# Patient Record
Sex: Female | Born: 1985 | Race: White | Hispanic: No | Marital: Married | Smoking: Never smoker
Health system: Southern US, Community
[De-identification: ages and names within clinical notes are randomized; demographics above are authoritative.]

## PROBLEM LIST (undated history)

## (undated) DIAGNOSIS — O139 Gestational [pregnancy-induced] hypertension without significant proteinuria, unspecified trimester: Secondary | ICD-10-CM

## (undated) DIAGNOSIS — Z8619 Personal history of other infectious and parasitic diseases: Secondary | ICD-10-CM

## (undated) DIAGNOSIS — L700 Acne vulgaris: Secondary | ICD-10-CM

## (undated) HISTORY — DX: Gestational (pregnancy-induced) hypertension without significant proteinuria, unspecified trimester: O13.9

## (undated) HISTORY — PX: NO PAST SURGERIES: SHX2092

## (undated) HISTORY — DX: Personal history of other infectious and parasitic diseases: Z86.19

---
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
## 2007-02-06 ENCOUNTER — Other Ambulatory Visit: Admission: RE | Admit: 2007-02-06 | Discharge: 2007-02-06 | Payer: Self-pay | Admitting: Obstetrics and Gynecology

## 2007-08-28 ENCOUNTER — Inpatient Hospital Stay (HOSPITAL_COMMUNITY): Admission: AD | Admit: 2007-08-28 | Discharge: 2007-08-31 | Payer: Self-pay | Admitting: Obstetrics and Gynecology

## 2010-05-29 NOTE — L&D Delivery Note (Signed)
Delivery Note At 1:12 AM a viable female was delivered via Vaginal, Spontaneous Delivery (Presentation: Right Occiput Posterior).  APGAR: 8, 9; weight 8 lb 13.1 oz (4000 g).   Placenta status: Intact, Spontaneous.  Cord: 3 vessels with the following complications: None.  Cord pH: NA  Anesthesia: Epidural  Episiotomy: None Lacerations: 2nd degree;Perineal Suture Repair: 3.0 vicryl Est. Blood Loss (mL): 250 ml  Mom to postpartum.  Baby to nursery-stable.  Jessica Pugh J. 05/13/2011, 1:37 AM

## 2010-09-12 LAB — ANTIBODY SCREEN: Antibody Screen: NEGATIVE

## 2010-09-12 LAB — ABO/RH: RH Type: NEGATIVE

## 2010-09-12 LAB — HIV ANTIBODY (ROUTINE TESTING W REFLEX): HIV: NONREACTIVE

## 2010-09-12 LAB — RUBELLA ANTIBODY, IGM: Rubella: IMMUNE

## 2010-10-11 NOTE — H&P (Signed)
NAME:  Jessica Pugh, Jessica Pugh                 ACCOUNT NO.:  [id number]   MEDICAL RECORD NO.:  [id number]          PATIENT TYPE:  INP   LOCATION:  9167                          FACILITY:  [facility]   PHYSICIAN:  Charles A. Delcambre, MDDATE OF BIRTH:  Jan 08, 1986   DATE OF ADMISSION:  08/28/2007  DATE OF DISCHARGE:                              HISTORY & PHYSICAL    A 25 year old gravida 1, para 0-0-0-0, [facility] September 14, 2007, at 39 weeks  and 6 days, presented to the office today for routine visit and was  noted to have blood pressure of 156/98, history of a headache last night  relieved with Tylenol.  Rechecked blood pressure was 150/96, trace  proteinuria, 8 pounds of weight gain over the last week, and see  physical exam below otherwise.  She is directly admitted now to labor  and delivery for induction secondary to gestational hypertension.   OBSTETRICAL LABS:  Antibody screen negative, VDRL nonreactive, rubella  immune, hepatitis B surface antigen negative, HIV negative, declined  first trimester screen and quad screen, 1-hour Glucola 95, antibody  screen negative at 28 weeks, hemoglobin 11.4 at 28 weeks, RPR at 28  weeks nonreactive, HIV at 36 weeks negative, gonorrhea and Chlamydia at  36 weeks both negative, group B strep is negative at 36 weeks.   PAST MEDICAL HISTORY:  None.   SURGICAL HISTORY:  None.   MEDICATIONS:  Allegra p.r.n. for allergies.   ALLERGIES:  No known drug allergies.   SOCIAL HISTORY:  Married, monogamous with her husband.   FAMILY HISTORY:  No major illnesses contributing.  Mother and multiple  others have had preeclampsia is only significance.   REVIEW OF SYSTEMS:  Headache last night resolved with Tylenol, no  scotomata or right upper quadrant pain.   Blood pressures as noted 156/98, repeat 150/96.  PIH LABS:  Negative and are faxed to labor and delivery.  HEENT:  Grossly normal limits.  LUNGS:  Clear bilaterally.  HEART:  Regular rate and rhythm with 2/6  systolic ejection murmur left  sternal border.  ABDOMEN:  Gravida, fundal height 40 cm, fetal heart rate 155.  Ultrasound estimated fetal weight 3174 g on August 21, 2007, 40th-50th  percentile, AFI 23.93rd percentile on August 21, 2007.  PELVIC EXAM:  Did not tolerate much of the pelvic exam.  One finger was  tolerated, but I could not reach the cervix.  She had a tight hymenal  ring and this did not tolerate a 2-digit exam.  Deep tendon reflexes are 3+ on the left, 2+ on the right, very brisk  edema is present up to knee and tight bilaterally.  Clonus 2 beats on the right, 3 beats on the left.   ASSESSMENT:  1. Gestational hypertension 39 weeks 6 days.  2. Unfavorable cervix with limitation of exam as noted above.   PLAN:  1. Direct admission, will use magnesium prophylaxis if blood pressures      remain elevated.  2. Cytotec 25 mcg q.4 h. Once she is 3 cm dilated, we will initiate      low-dose Pitocin and  attempt to AROM as indicated.  PIH labs faxed      to labor and delivery and I note that they are creatinine 0.7, uric      acid 5.8, AST 25, ALT 16, hemoglobin 10.9, hematocrit 32.3,      platelets 173.  Allow to eat lunch if not in labor, same for dinner      if not in labor, continue Cytotec until dilated to 3 cm.  If unable      to place Cytotec, initiate low dose Pitocin.  She gives informed      consent and will follow up as directed.  She did receive RhoGAM at      28 weeks.      Charles A. Sydnee Cabal, MD  Electronically Signed     CAD/MEDQ  D:  08/28/2007  T:  08/29/2007  Job:  [id number]

## 2010-10-19 ENCOUNTER — Other Ambulatory Visit (HOSPITAL_COMMUNITY)
Admission: RE | Admit: 2010-10-19 | Discharge: 2010-10-19 | Disposition: A | Discharge status: Home / Self Care | Payer: 59 | Source: Ambulatory Visit | Attending: Obstetrics and Gynecology | Admitting: Obstetrics and Gynecology

## 2010-10-19 DIAGNOSIS — Z113 Encounter for screening for infections with a predominantly sexual mode of transmission: Secondary | ICD-10-CM | POA: Insufficient documentation

## 2010-10-19 DIAGNOSIS — Z01419 Encounter for gynecological examination (general) (routine) without abnormal findings: Secondary | ICD-10-CM | POA: Insufficient documentation

## 2011-02-21 LAB — COMPREHENSIVE METABOLIC PANEL
ALT: 13
AST: 41 — ABNORMAL HIGH
Albumin: 1.6 — ABNORMAL LOW
Albumin: 2.2 — ABNORMAL LOW
Alkaline Phosphatase: 189 — ABNORMAL HIGH
BUN: 6
BUN: 6
CO2: 26
Calcium: 6.6 — ABNORMAL LOW
Calcium: 7.5 — ABNORMAL LOW
Chloride: 102
Creatinine, Ser: 0.87
Creatinine, Ser: 0.95
GFR calc Af Amer: >60
GFR calc non Af Amer: >60
Glucose, Bld: 88
Potassium: 4.2
Total Bilirubin: 0.5
Total Bilirubin: 0.9
Total Protein: 5.7 — ABNORMAL LOW

## 2011-02-21 LAB — CBC
HCT: 22.2 — ABNORMAL LOW
HCT: 24.6 — ABNORMAL LOW
HCT: 29.8 — ABNORMAL LOW
HCT: 34.2 — ABNORMAL LOW
Hemoglobin: 10.5 — ABNORMAL LOW
Hemoglobin: 11.5 — ABNORMAL LOW
Hemoglobin: 7.6 — CL
MCHC: 33.7
MCHC: 34.2
MCHC: 35.2
MCV: 90.6
MCV: 91.4
MCV: 91.9
MCV: 92.2
Platelets: 172
Platelets: 183
Platelets: 189
Platelets: 191
RBC: 2.42 — ABNORMAL LOW
RBC: 3.29 — ABNORMAL LOW
RBC: 3.71 — ABNORMAL LOW
RDW: 13.1
RDW: 13.2
WBC: 11.2 — ABNORMAL HIGH
WBC: 18.8 — ABNORMAL HIGH
WBC: 23.4 — ABNORMAL HIGH
WBC: 26.3 — ABNORMAL HIGH

## 2011-02-21 LAB — RH IMMUNE GLOB WKUP(>/=20WKS)(NOT WOMEN'S HOSP)

## 2011-02-21 LAB — LACTATE DEHYDROGENASE: LDH: 438 — ABNORMAL HIGH

## 2011-02-21 LAB — URIC ACID
Uric Acid, Serum: 5.7
Uric Acid, Serum: 6.7

## 2011-02-21 LAB — DIFFERENTIAL
Eosinophils Absolute: 0
Lymphs Abs: 1.3
Neutro Abs: 21.1 — ABNORMAL HIGH
Neutrophils Relative %: 90 — ABNORMAL HIGH

## 2011-02-21 LAB — RPR: RPR Ser Ql: NONREACTIVE

## 2011-02-21 LAB — MAGNESIUM
Magnesium: 5.5 — ABNORMAL HIGH
Magnesium: 6 — ABNORMAL HIGH
Magnesium: 6.2
Magnesium: 6.9
Magnesium: 8

## 2011-05-08 ENCOUNTER — Encounter (HOSPITAL_COMMUNITY): Payer: Self-pay | Admitting: *Deleted

## 2011-05-08 ENCOUNTER — Telehealth (HOSPITAL_COMMUNITY): Payer: Self-pay | Admitting: *Deleted

## 2011-05-08 NOTE — Telephone Encounter (Signed)
Preadmission screen  

## 2011-05-11 ENCOUNTER — Other Ambulatory Visit: Payer: Self-pay | Admitting: Obstetrics and Gynecology

## 2011-05-12 ENCOUNTER — Inpatient Hospital Stay (HOSPITAL_COMMUNITY): Admission: RE | Admit: 2011-05-12 | Payer: 59 | Source: Ambulatory Visit

## 2011-05-12 ENCOUNTER — Encounter (HOSPITAL_COMMUNITY): Payer: Self-pay

## 2011-05-12 ENCOUNTER — Inpatient Hospital Stay (HOSPITAL_COMMUNITY): Payer: 59 | Admitting: Anesthesiology

## 2011-05-12 ENCOUNTER — Encounter (HOSPITAL_COMMUNITY): Payer: Self-pay | Admitting: Anesthesiology

## 2011-05-12 ENCOUNTER — Inpatient Hospital Stay (HOSPITAL_COMMUNITY)
Admission: RE | Admit: 2011-05-12 | Discharge: 2011-05-14 | DRG: 775 | Disposition: A | Discharge status: Home / Self Care | Payer: 59 | Source: Ambulatory Visit | Attending: Obstetrics and Gynecology | Admitting: Obstetrics and Gynecology

## 2011-05-12 LAB — CBC
MCH: 31.6 pg (ref 26.0–34.0)
Platelets: 194 10*3/uL (ref 150–400)
RBC: 3.7 MIL/uL — ABNORMAL LOW (ref 3.87–5.11)
RDW: 12.5 % (ref 11.5–15.5)
WBC: 11.6 10*3/uL — ABNORMAL HIGH (ref 4.0–10.5)

## 2011-05-12 LAB — RPR: RPR Ser Ql: NONREACTIVE

## 2011-05-12 MED ORDER — LIDOCAINE HCL 1.5 % IJ SOLN
INTRAMUSCULAR | Status: DC | PRN
  Administered 2011-05-12: 4 mL via INTRADERMAL
  Administered 2011-05-12: 3 mL via EPIDURAL

## 2011-05-12 MED ORDER — OXYTOCIN 20 UNITS IN LACTATED RINGERS INFUSION - SIMPLE
INTRAVENOUS | Status: AC
  Administered 2011-05-12: 2 m[IU]/min via INTRAVENOUS
  Filled 2011-05-12: qty 1000

## 2011-05-12 MED ORDER — PHENYLEPHRINE 40 MCG/ML (10ML) SYRINGE FOR IV PUSH (FOR BLOOD PRESSURE SUPPORT)
80.0000 ug | PREFILLED_SYRINGE | INTRAVENOUS | Status: DC | PRN
  Filled 2011-05-12: qty 5

## 2011-05-12 MED ORDER — ONDANSETRON HCL 4 MG/2ML IJ SOLN: 4.0000 mg | Freq: Four times a day (QID) | INTRAMUSCULAR | Status: DC | PRN

## 2011-05-12 MED ORDER — FENTANYL 2.5 MCG/ML BUPIVACAINE 1/10 % EPIDURAL INFUSION (WH - ANES)
INTRAMUSCULAR | Status: DC | PRN
  Administered 2011-05-12: 13 mL/h via EPIDURAL

## 2011-05-12 MED ORDER — LACTATED RINGERS IV SOLN
INTRAVENOUS | Status: DC
  Administered 2011-05-12 (×4): via INTRAVENOUS

## 2011-05-12 MED ORDER — LIDOCAINE HCL (PF) 1 % IJ SOLN
30.0000 mL | INTRAMUSCULAR | Status: DC | PRN
  Filled 2011-05-12: qty 30

## 2011-05-12 MED ORDER — FENTANYL 2.5 MCG/ML BUPIVACAINE 1/10 % EPIDURAL INFUSION (WH - ANES)
14.0000 mL/h | INTRAMUSCULAR | Status: DC
  Administered 2011-05-12: 14 mL/h via EPIDURAL
  Filled 2011-05-12 (×2): qty 60

## 2011-05-12 MED ORDER — FLEET ENEMA 7-19 GM/118ML RE ENEM: 1.0000 | ENEMA | RECTAL | Status: DC | PRN

## 2011-05-12 MED ORDER — TERBUTALINE SULFATE 1 MG/ML IJ SOLN: 0.2500 mg | Freq: Once | INTRAMUSCULAR | Status: AC | PRN

## 2011-05-12 MED ORDER — OXYTOCIN 20 UNITS IN LACTATED RINGERS INFUSION - SIMPLE
125.0000 mL/h | Freq: Once | INTRAVENOUS | Status: AC
  Administered 2011-05-13: 999 mL/h via INTRAVENOUS

## 2011-05-12 MED ORDER — LACTATED RINGERS IV SOLN
500.0000 mL | INTRAVENOUS | Status: DC | PRN
  Administered 2011-05-12: 500 mL via INTRAVENOUS

## 2011-05-12 MED ORDER — OXYTOCIN BOLUS FROM INFUSION
500.0000 mL | Freq: Once | INTRAVENOUS | Status: DC
  Filled 2011-05-12: qty 500

## 2011-05-12 MED ORDER — ACETAMINOPHEN 325 MG PO TABS: 650.0000 mg | ORAL_TABLET | ORAL | Status: DC | PRN

## 2011-05-12 MED ORDER — LACTATED RINGERS IV SOLN
500.0000 mL | Freq: Once | INTRAVENOUS | Status: AC
  Administered 2011-05-12: 500 mL via INTRAVENOUS

## 2011-05-12 MED ORDER — PHENYLEPHRINE 40 MCG/ML (10ML) SYRINGE FOR IV PUSH (FOR BLOOD PRESSURE SUPPORT): 80.0000 ug | PREFILLED_SYRINGE | INTRAVENOUS | Status: DC | PRN

## 2011-05-12 MED ORDER — OXYCODONE-ACETAMINOPHEN 5-325 MG PO TABS: 2.0000 | ORAL_TABLET | ORAL | Status: DC | PRN

## 2011-05-12 MED ORDER — DIPHENHYDRAMINE HCL 50 MG/ML IJ SOLN: 12.5000 mg | INTRAMUSCULAR | Status: DC | PRN

## 2011-05-12 MED ORDER — EPHEDRINE 5 MG/ML INJ
10.0000 mg | INTRAVENOUS | Status: DC | PRN
  Filled 2011-05-12: qty 4

## 2011-05-12 MED ORDER — MISOPROSTOL 25 MCG QUARTER TABLET
25.0000 ug | ORAL_TABLET | ORAL | Status: DC | PRN
  Administered 2011-05-12: 25 ug via VAGINAL
  Filled 2011-05-12: qty 0.25

## 2011-05-12 MED ORDER — IBUPROFEN 600 MG PO TABS: 600.0000 mg | ORAL_TABLET | Freq: Four times a day (QID) | ORAL | Status: DC | PRN

## 2011-05-12 MED ORDER — OXYTOCIN 20 UNITS IN LACTATED RINGERS INFUSION - SIMPLE
1.0000 m[IU]/min | INTRAVENOUS | Status: DC
  Administered 2011-05-12: 2 m[IU]/min via INTRAVENOUS

## 2011-05-12 MED ORDER — CITRIC ACID-SODIUM CITRATE 334-500 MG/5ML PO SOLN: 30.0000 mL | ORAL | Status: DC | PRN

## 2011-05-12 MED ORDER — EPHEDRINE 5 MG/ML INJ: 10.0000 mg | INTRAVENOUS | Status: DC | PRN

## 2011-05-12 NOTE — H&P (Signed)
Jessica Pugh is a 25 y.o. female presenting for elective induction at 40 wks. She has a h/o Preeclampsia with previous pregnancy. She denies ha/ blurred vision or ruq pain.   .  OB History    Grav Para Term Preterm Abortions TAB SAB Ect Mult Living   2 1 1       1      Past Medical History  Diagnosis Date  . Pregnancy induced hypertension     first pregnancy  . History of chicken pox    Past Surgical History  Procedure Date  . No past surgeries    Family History: family history is not on file. Social History:  reports that she has never smoked. She does not have any smokeless tobacco history on file. She reports that she does not drink alcohol or use illicit drugs.  Meds PNV Allergies nkda PGYN hx negative   Dilation: Fingertip Effacement (%): 50 Station: -2 Exam by:: Dr. Richardson Dopp Blood pressure 127/75, pulse 87, temperature 98 F (36.7 C), temperature source Oral, resp. rate 18, height 5\' 1"  (1.549 m), weight 66.225 kg (146 lb), last menstrual period 07/19/2010. CV rrr Lungs clear abd gravid nontender Ext trace edema 1+ reflexes  Prenatal labs: ABO, Rh: O/Negative/-- (04/16 0000) Antibody: Negative (04/16 0000) Rubella:   RPR: NON REACTIVE (12/14 0753)  HBsAg: Negative (04/16 0000)  HIV: Non-reactive (04/16 0000)  GBS: Negative (11/12 0000)   Assessment/Plan: 40 wks for social induction cytotec for cervical ripening.     Jessica Pugh J. 05/12/2011, 1:48 PM

## 2011-05-12 NOTE — Treatment Plan (Signed)
Dr. Richardson Dopp on phone, updated on pt status, notified of UC pattern, FHR reactive, orders received to hold Cytotec until UC less frequent then place Cytotec at that time.  Raliegh Ip RN

## 2011-05-12 NOTE — Progress Notes (Signed)
Dr. Richardson Dopp at bedside, placed IUPC, orders received to recheck pt cervix in 1 hr and notify MD of SVE,  if no cervical change, orders received to begin low dose Pitocin per protocol

## 2011-05-12 NOTE — Anesthesia Procedure Notes (Signed)
Epidural Patient location during procedure: OB Start time: 05/12/2011 6:44 PM  Staffing Anesthesiologist: Arye Weyenberg A. Performed by: anesthesiologist   Preanesthetic Checklist Completed: patient identified, site marked, surgical consent, pre-op evaluation, timeout performed, IV checked, risks and benefits discussed and monitors and equipment checked  Epidural Patient position: sitting Prep: site prepped and draped and DuraPrep Patient monitoring: continuous pulse ox and blood pressure Approach: midline Injection technique: LOR air  Needle:  Needle type: Tuohy  Needle gauge: 17 G Needle length: 9 cm Needle insertion depth: 5 cm cm Catheter type: closed end flexible Catheter size: 19 Gauge Catheter at skin depth: 10 cm Test dose: negative and 1.5% lidocaine  Assessment Events: blood not aspirated, injection not painful, no injection resistance, negative IV test and no paresthesia  Additional Notes Patient is more comfortable after epidural dosed. Please see RN's note for documentation of vital signs and FHR which are stable.

## 2011-05-12 NOTE — Plan of Care (Signed)
Dr. Richardson Dopp called, notified of tachysystolic UC pattern, FHR reactive, orders received to D/C Cytotec order.

## 2011-05-12 NOTE — Progress Notes (Signed)
Jessica Pugh is a 25 y.o. G2P1001 at [redacted]w[redacted]d by LMP admitted for induction of labor due to elective social induciton .  Subjective: pt desires an epidural    Objective: BP 126/67  Pulse 95  Temp(Src) 98.2 F (36.8 C) (Oral)  Resp 20  Ht 5\' 1"  (1.549 m)  Wt 66.225 kg (146 lb)  BMI 27.59 kg/m2  LMP 07/19/2010      FHT:  FHR: 140 bpm, variability: moderate,  accelerations:  Present,  decelerations:  Absent UC:   regular, every 1 minutes SVE:   Dilation: 3.5 Effacement (%): 50 Station: -1 Exam by:: Dr. Richardson Dopp  Labs: Lab Results  Component Value Date   WBC 11.6* 05/12/2011   HGB 11.7* 05/12/2011   HCT 34.3* 05/12/2011   MCV 92.7 05/12/2011   PLT 194 05/12/2011    Assessment / Plan: Induction of labor due to social,  progressing well on pitocin Pt is not on pitocin she had one dosage of cytotect at 830am  Labor: progressing after one dose of cytotec  Preeclampsia:  no signs or symptoms of toxicity Fetal Wellbeing:  Category I Pain Control:  Epidural I/D:  n/a Anticipated MOD:  NSVD  Jessica Pugh J. 05/12/2011, 5:40 PM

## 2011-05-12 NOTE — Progress Notes (Signed)
In to assess patient. She is comfortable with her epidural  AF VSS FHR baseline 140's good btbv + accels no decels  Toco ctx q 1-3 minutes cx 3-4 /50/-1 Arom clear fluid A/p 40 wks for social induction  Anticipate svd If no cervical change in 1 hr add low dose pitocin

## 2011-05-12 NOTE — Anesthesia Preprocedure Evaluation (Signed)
Anesthesia Evaluation  Patient identified by MRN, date of birth, ID band Patient awake    Reviewed: Allergy & Precautions, H&P , Patient's Chart, lab work & pertinent test results  Airway Mallampati: III TM Distance: >3 FB Neck ROM: full    Dental No notable dental hx. (+) Teeth Intact   Pulmonary neg pulmonary ROS,  clear to auscultation  Pulmonary exam normal       Cardiovascular neg cardio ROS regular Normal    Neuro/Psych Negative Neurological ROS  Negative Psych ROS   GI/Hepatic negative GI ROS, Neg liver ROS,   Endo/Other  Negative Endocrine ROS  Renal/GU negative Renal ROS  Genitourinary negative   Musculoskeletal   Abdominal Normal abdominal exam  (+)   Peds  Hematology negative hematology ROS (+)   Anesthesia Other Findings   Reproductive/Obstetrics (+) Pregnancy                           Anesthesia Physical Anesthesia Plan  ASA: II  Anesthesia Plan: Epidural   Post-op Pain Management:    Induction:   Airway Management Planned:   Additional Equipment:   Intra-op Plan:   Post-operative Plan:   Informed Consent: I have reviewed the patients History and Physical, chart, labs and discussed the procedure including the risks, benefits and alternatives for the proposed anesthesia with the patient or authorized representative who has indicated his/her understanding and acceptance.     Plan Discussed with: Anesthesiologist  Anesthesia Plan Comments:         Anesthesia Quick Evaluation  

## 2011-05-13 ENCOUNTER — Encounter (HOSPITAL_COMMUNITY): Payer: Self-pay

## 2011-05-13 LAB — ABO/RH: ABO/RH(D): O NEG

## 2011-05-13 LAB — CBC
HCT: 27.7 % — ABNORMAL LOW (ref 36.0–46.0)
Hemoglobin: 9.4 g/dL — ABNORMAL LOW (ref 12.0–15.0)
MCHC: 34.3 g/dL (ref 30.0–36.0)
MCV: 92.6 fL (ref 78.0–100.0)

## 2011-05-13 MED ORDER — IBUPROFEN 600 MG PO TABS
600.0000 mg | ORAL_TABLET | Freq: Four times a day (QID) | ORAL | Status: DC
  Administered 2011-05-13 – 2011-05-14 (×5): 600 mg via ORAL
  Filled 2011-05-13 (×5): qty 1

## 2011-05-13 MED ORDER — DIBUCAINE 1 % RE OINT: 1.0000 "application " | TOPICAL_OINTMENT | RECTAL | Status: DC | PRN

## 2011-05-13 MED ORDER — ONDANSETRON HCL 4 MG PO TABS: 4.0000 mg | ORAL_TABLET | ORAL | Status: DC | PRN

## 2011-05-13 MED ORDER — SIMETHICONE 80 MG PO CHEW: 80.0000 mg | CHEWABLE_TABLET | ORAL | Status: DC | PRN

## 2011-05-13 MED ORDER — WITCH HAZEL-GLYCERIN EX PADS: 1.0000 "application " | MEDICATED_PAD | CUTANEOUS | Status: DC | PRN

## 2011-05-13 MED ORDER — ZOLPIDEM TARTRATE 5 MG PO TABS: 5.0000 mg | ORAL_TABLET | Freq: Every evening | ORAL | Status: DC | PRN

## 2011-05-13 MED ORDER — BENZOCAINE-MENTHOL 20-0.5 % EX AERO
1.0000 "application " | INHALATION_SPRAY | CUTANEOUS | Status: DC | PRN
  Administered 2011-05-13: 1 via TOPICAL

## 2011-05-13 MED ORDER — METHYLERGONOVINE MALEATE 0.2 MG/ML IJ SOLN: 0.2000 mg | INTRAMUSCULAR | Status: DC | PRN

## 2011-05-13 MED ORDER — METHYLERGONOVINE MALEATE 0.2 MG PO TABS: 0.2000 mg | ORAL_TABLET | ORAL | Status: DC | PRN

## 2011-05-13 MED ORDER — OXYCODONE-ACETAMINOPHEN 5-325 MG PO TABS
1.0000 | ORAL_TABLET | ORAL | Status: DC | PRN
  Administered 2011-05-13 (×3): 1 via ORAL
  Filled 2011-05-13 (×3): qty 1

## 2011-05-13 MED ORDER — LACTATED RINGERS IV SOLN
INTRAVENOUS | Status: DC
  Administered 2011-05-12: 23:00:00 via INTRAUTERINE

## 2011-05-13 MED ORDER — PRENATAL PLUS 27-1 MG PO TABS
1.0000 | ORAL_TABLET | Freq: Every day | ORAL | Status: DC
  Administered 2011-05-13 – 2011-05-14 (×2): 1 via ORAL
  Filled 2011-05-13 (×2): qty 1

## 2011-05-13 MED ORDER — DIPHENHYDRAMINE HCL 25 MG PO CAPS: 25.0000 mg | ORAL_CAPSULE | Freq: Four times a day (QID) | ORAL | Status: DC | PRN

## 2011-05-13 MED ORDER — ONDANSETRON HCL 4 MG/2ML IJ SOLN: 4.0000 mg | INTRAMUSCULAR | Status: DC | PRN

## 2011-05-13 MED ORDER — LANOLIN HYDROUS EX OINT: TOPICAL_OINTMENT | CUTANEOUS | Status: DC | PRN

## 2011-05-13 MED ORDER — SENNOSIDES-DOCUSATE SODIUM 8.6-50 MG PO TABS
2.0000 | ORAL_TABLET | Freq: Every day | ORAL | Status: DC
  Administered 2011-05-13: 2 via ORAL

## 2011-05-13 MED ORDER — BENZOCAINE-MENTHOL 20-0.5 % EX AERO
INHALATION_SPRAY | CUTANEOUS | Status: AC
  Administered 2011-05-13: 1 via TOPICAL
  Filled 2011-05-13: qty 56

## 2011-05-13 NOTE — Anesthesia Postprocedure Evaluation (Signed)
Anesthesia Post Note  Patient: Jessica Pugh  Procedure(s) Performed: * No procedures listed *  Anesthesia type: Epidural  Patient location: Mother/Baby  Post pain: Pain level controlled  Post assessment: Post-op Vital signs reviewed  Last Vitals:  Filed Vitals:   05/13/11 0900  BP: 110/68  Pulse: 76  Temp: 36.7 C  Resp: 18    Post vital signs: Reviewed  Level of consciousness: awake  Complications: No apparent anesthesia complications

## 2011-05-14 MED ORDER — IBUPROFEN 600 MG PO TABS: 600.0000 mg | ORAL_TABLET | Freq: Four times a day (QID) | ORAL | Status: AC | PRN

## 2011-05-14 MED ORDER — RHO D IMMUNE GLOBULIN 1500 UNIT/2ML IJ SOLN
300.0000 ug | Freq: Once | INTRAMUSCULAR | Status: AC
  Administered 2011-05-14: 300 ug via INTRAMUSCULAR
  Filled 2011-05-14: qty 2

## 2011-05-14 NOTE — Discharge Summary (Signed)
Obstetric Discharge Summary Reason for Admission: induction of labor Prenatal Procedures: none Intrapartum Procedures: spontaneous vaginal delivery Postpartum Procedures: none Complications-Operative and Postpartum: 2nd degree perineal laceration Hemoglobin  Date Value Range Status  05/13/2011 9.4* 12.0-15.0 (g/dL) Final     REPEATED TO VERIFY     DELTA CHECK NOTED     HCT  Date Value Range Status  05/13/2011 27.7* 36.0-46.0 (%) Final    Discharge Diagnoses: Term Pregnancy-delivered  Discharge Information: Date: 05/14/2011 Activity: pelvic rest Diet: routine Medications: PNV and Ibuprofen Condition: stable Instructions: refer to practice specific booklet Discharge to: home Follow-up Information    Follow up with [name] Call in 1 week. (Nurse visit for bp check )    Contact information:   [address] [place] [address] [phone]          Newborn Data: Live born female  Birth Weight: 8 lb 13.1 oz (4000 g) APGAR: 8, 9  Home with mother.  Jessica Pugh J. 05/14/2011, 9:52 AM

## 2011-05-14 NOTE — Progress Notes (Signed)
Post Partum Day 1 Subjective: no complaints, up ad lib, voiding and tolerating PO  Objective: Blood pressure 99/60, pulse 79, temperature 98 F (36.7 C), temperature source Oral, resp. rate 18, height 5\' 1"  (1.549 m), weight 66.225 kg (146 lb), last menstrual period 07/19/2010, SpO2 98.00%, unknown if currently breastfeeding.  Physical Exam:  General: alert and cooperative Lochia: appropriate Uterine Fundus: firm Incision: NA DVT Evaluation: No evidence of DVT seen on physical exam.   Basename 05/13/11 0547 05/12/11 0753  HGB 9.4* 11.7*  HCT 27.7* 34.3*    Assessment/Plan: Discharge home and Contraception will discuss at postpartum vist.   LOS: 2 days   Rickardo Brinegar J. 05/14/2011, 9:49 AM

## 2011-05-15 LAB — RH IG WORKUP (INCLUDES ABO/RH)
ABO/RH(D): O NEG
Unit division: 0

## 2014-03-30 ENCOUNTER — Encounter (HOSPITAL_COMMUNITY): Payer: Self-pay

## 2018-02-04 ENCOUNTER — Other Ambulatory Visit: Payer: Self-pay

## 2018-02-04 ENCOUNTER — Encounter (HOSPITAL_BASED_OUTPATIENT_CLINIC_OR_DEPARTMENT_OTHER): Payer: Self-pay

## 2018-02-04 DIAGNOSIS — I351 Nonrheumatic aortic (valve) insufficiency: Secondary | ICD-10-CM | POA: Diagnosis present

## 2018-02-04 DIAGNOSIS — L02213 Cutaneous abscess of chest wall: Secondary | ICD-10-CM | POA: Diagnosis present

## 2018-02-04 DIAGNOSIS — E041 Nontoxic single thyroid nodule: Secondary | ICD-10-CM | POA: Diagnosis not present

## 2018-02-04 DIAGNOSIS — Z23 Encounter for immunization: Secondary | ICD-10-CM

## 2018-02-04 DIAGNOSIS — J189 Pneumonia, unspecified organism: Secondary | ICD-10-CM | POA: Diagnosis not present

## 2018-02-04 DIAGNOSIS — D649 Anemia, unspecified: Secondary | ICD-10-CM | POA: Diagnosis present

## 2018-02-04 DIAGNOSIS — R7881 Bacteremia: Secondary | ICD-10-CM | POA: Diagnosis present

## 2018-02-04 DIAGNOSIS — Z8042 Family history of malignant neoplasm of prostate: Secondary | ICD-10-CM

## 2018-02-04 DIAGNOSIS — I2690 Septic pulmonary embolism without acute cor pulmonale: Secondary | ICD-10-CM | POA: Diagnosis present

## 2018-02-04 DIAGNOSIS — R091 Pleurisy: Secondary | ICD-10-CM | POA: Diagnosis present

## 2018-02-04 DIAGNOSIS — Z841 Family history of disorders of kidney and ureter: Secondary | ICD-10-CM

## 2018-02-04 DIAGNOSIS — F419 Anxiety disorder, unspecified: Secondary | ICD-10-CM | POA: Diagnosis present

## 2018-02-04 DIAGNOSIS — L700 Acne vulgaris: Secondary | ICD-10-CM | POA: Diagnosis present

## 2018-02-04 DIAGNOSIS — B9561 Methicillin susceptible Staphylococcus aureus infection as the cause of diseases classified elsewhere: Secondary | ICD-10-CM | POA: Diagnosis present

## 2018-02-04 LAB — URINALYSIS, MICROSCOPIC (REFLEX)

## 2018-02-04 LAB — URINALYSIS, ROUTINE W REFLEX MICROSCOPIC
Bilirubin Urine: NEGATIVE
GLUCOSE, UA: NEGATIVE mg/dL
Ketones, ur: NEGATIVE mg/dL
LEUKOCYTES UA: NEGATIVE
NITRITE: NEGATIVE
PROTEIN: NEGATIVE mg/dL
Specific Gravity, Urine: 1.02 (ref 1.005–1.030)
pH: 5.5 (ref 5.0–8.0)

## 2018-02-04 LAB — PREGNANCY, URINE: Preg Test, Ur: NEGATIVE

## 2018-02-04 NOTE — ED Triage Notes (Signed)
Pt c/o mid to upper back pain for the last few days that is worse with movement, also has what sounds like an abscess to chest, pt tried tylenol yesterday

## 2018-02-05 ENCOUNTER — Emergency Department (HOSPITAL_BASED_OUTPATIENT_CLINIC_OR_DEPARTMENT_OTHER): Payer: 59

## 2018-02-05 ENCOUNTER — Observation Stay (HOSPITAL_BASED_OUTPATIENT_CLINIC_OR_DEPARTMENT_OTHER): Payer: 59

## 2018-02-05 ENCOUNTER — Inpatient Hospital Stay (HOSPITAL_BASED_OUTPATIENT_CLINIC_OR_DEPARTMENT_OTHER)
Admission: EM | Admit: 2018-02-05 | Discharge: 2018-02-08 | DRG: 193 | Disposition: A | Discharge status: Home / Self Care | Payer: 59 | Attending: Internal Medicine | Admitting: Internal Medicine

## 2018-02-05 ENCOUNTER — Encounter (HOSPITAL_COMMUNITY): Payer: Self-pay | Admitting: Internal Medicine

## 2018-02-05 DIAGNOSIS — I76 Septic arterial embolism: Secondary | ICD-10-CM | POA: Insufficient documentation

## 2018-02-05 DIAGNOSIS — F419 Anxiety disorder, unspecified: Secondary | ICD-10-CM | POA: Diagnosis present

## 2018-02-05 DIAGNOSIS — L02213 Cutaneous abscess of chest wall: Secondary | ICD-10-CM | POA: Diagnosis not present

## 2018-02-05 DIAGNOSIS — Z8042 Family history of malignant neoplasm of prostate: Secondary | ICD-10-CM | POA: Diagnosis not present

## 2018-02-05 DIAGNOSIS — B9561 Methicillin susceptible Staphylococcus aureus infection as the cause of diseases classified elsewhere: Secondary | ICD-10-CM | POA: Diagnosis present

## 2018-02-05 DIAGNOSIS — L700 Acne vulgaris: Secondary | ICD-10-CM

## 2018-02-05 DIAGNOSIS — R7881 Bacteremia: Secondary | ICD-10-CM

## 2018-02-05 DIAGNOSIS — D72829 Elevated white blood cell count, unspecified: Secondary | ICD-10-CM

## 2018-02-05 DIAGNOSIS — Z23 Encounter for immunization: Secondary | ICD-10-CM | POA: Diagnosis not present

## 2018-02-05 DIAGNOSIS — J189 Pneumonia, unspecified organism: Secondary | ICD-10-CM | POA: Diagnosis not present

## 2018-02-05 DIAGNOSIS — D649 Anemia, unspecified: Secondary | ICD-10-CM | POA: Diagnosis present

## 2018-02-05 DIAGNOSIS — L0291 Cutaneous abscess, unspecified: Secondary | ICD-10-CM | POA: Diagnosis not present

## 2018-02-05 DIAGNOSIS — I351 Nonrheumatic aortic (valve) insufficiency: Secondary | ICD-10-CM | POA: Diagnosis present

## 2018-02-05 DIAGNOSIS — E041 Nontoxic single thyroid nodule: Secondary | ICD-10-CM | POA: Diagnosis present

## 2018-02-05 DIAGNOSIS — I2690 Septic pulmonary embolism without acute cor pulmonale: Secondary | ICD-10-CM | POA: Diagnosis not present

## 2018-02-05 DIAGNOSIS — Z841 Family history of disorders of kidney and ureter: Secondary | ICD-10-CM | POA: Diagnosis not present

## 2018-02-05 DIAGNOSIS — R091 Pleurisy: Secondary | ICD-10-CM | POA: Diagnosis not present

## 2018-02-05 DIAGNOSIS — J188 Other pneumonia, unspecified organism: Secondary | ICD-10-CM | POA: Diagnosis present

## 2018-02-05 HISTORY — DX: Acne vulgaris: L70.0

## 2018-02-05 LAB — CBC
HEMATOCRIT: 35.2 % — AB (ref 36.0–46.0)
HEMOGLOBIN: 11.9 g/dL — AB (ref 12.0–15.0)
MCH: 31.5 pg (ref 26.0–34.0)
MCHC: 33.8 g/dL (ref 30.0–36.0)
MCV: 93.1 fL (ref 78.0–100.0)
Platelets: 203 10*3/uL (ref 150–400)
RBC: 3.78 MIL/uL — ABNORMAL LOW (ref 3.87–5.11)
RDW: 12.9 % (ref 11.5–15.5)
WBC: 11.9 10*3/uL — ABNORMAL HIGH (ref 4.0–10.5)

## 2018-02-05 LAB — CBC WITH DIFFERENTIAL/PLATELET
BASOS ABS: 0 10*3/uL (ref 0.0–0.1)
Basophils Relative: 0 %
Eosinophils Absolute: 0.2 10*3/uL (ref 0.0–0.7)
Eosinophils Relative: 1 %
HCT: 36.3 % (ref 36.0–46.0)
HEMOGLOBIN: 12.4 g/dL (ref 12.0–15.0)
LYMPHS ABS: 1.6 10*3/uL (ref 0.7–4.0)
LYMPHS PCT: 14 %
MCH: 31.8 pg (ref 26.0–34.0)
MCHC: 34.2 g/dL (ref 30.0–36.0)
MCV: 93.1 fL (ref 78.0–100.0)
MONO ABS: 1 10*3/uL (ref 0.1–1.0)
MONOS PCT: 9 %
Neutro Abs: 8.4 10*3/uL (ref 1.7–7.7)
Neutrophils Relative %: 76 %
PLATELETS: 176 10*3/uL (ref 150–400)
RBC: 3.9 MIL/uL (ref 3.87–5.11)
RDW: 12.7 % (ref 11.5–15.5)
WBC: 11.1 10*3/uL — ABNORMAL HIGH (ref 4.0–10.5)

## 2018-02-05 LAB — BASIC METABOLIC PANEL
ANION GAP: 9 (ref 5–15)
Anion gap: 10 (ref 5–15)
CHLORIDE: 106 mmol/L (ref 98–111)
CHLORIDE: 106 mmol/L (ref 98–111)
CO2: 24 mmol/L (ref 22–32)
CO2: 23 mmol/L (ref 22–32)
Calcium: 9.1 mg/dL (ref 8.9–10.3)
Calcium: 8.9 mg/dL (ref 8.9–10.3)
Creatinine, Ser: 0.64 mg/dL (ref 0.44–1.00)
Creatinine, Ser: 0.66 mg/dL (ref 0.44–1.00)
GFR calc Af Amer: >60 mL/min (ref >60)
GFR calc Af Amer: >60 mL/min (ref >60)
GFR calc non Af Amer: >60 mL/min (ref >60)
Glucose, Bld: 101 mg/dL — ABNORMAL HIGH (ref 70–99)
Glucose, Bld: 138 mg/dL — ABNORMAL HIGH (ref 70–99)
POTASSIUM: 3.7 mmol/L (ref 3.5–5.1)
POTASSIUM: 3.7 mmol/L (ref 3.5–5.1)
Sodium: 139 mmol/L (ref 135–145)
Sodium: 139 mmol/L (ref 135–145)

## 2018-02-05 LAB — STREP PNEUMONIAE URINARY ANTIGEN: STREP PNEUMO URINARY ANTIGEN: NEGATIVE

## 2018-02-05 LAB — RAPID URINE DRUG SCREEN, HOSP PERFORMED
Amphetamines: NOT DETECTED
Barbiturates: NOT DETECTED
Benzodiazepines: NOT DETECTED
Cocaine: NOT DETECTED
Opiates: NOT DETECTED
TETRAHYDROCANNABINOL: NOT DETECTED

## 2018-02-05 LAB — HIV ANTIBODY (ROUTINE TESTING W REFLEX): HIV Screen 4th Generation wRfx: NONREACTIVE

## 2018-02-05 LAB — I-STAT CG4 LACTIC ACID, ED: LACTIC ACID, VENOUS: 0.93 mmol/L (ref 0.5–1.9)

## 2018-02-05 LAB — TSH: TSH: 2.128 u[IU]/mL (ref 0.350–4.500)

## 2018-02-05 LAB — ECHOCARDIOGRAM COMPLETE
Height: 61 in
Weight: 1854.4 oz

## 2018-02-05 LAB — D-DIMER, QUANTITATIVE: D-Dimer, Quant: 0.6 ug/mL-FEU — ABNORMAL HIGH (ref 0.00–0.50)

## 2018-02-05 MED ORDER — SODIUM CHLORIDE 0.9 % IV SOLN
1.0000 g | Freq: Once | INTRAVENOUS | Status: AC
  Administered 2018-02-05: 1 g via INTRAVENOUS

## 2018-02-05 MED ORDER — LIDOCAINE-EPINEPHRINE (PF) 2 %-1:200000 IJ SOLN
INTRAMUSCULAR | Status: AC
  Filled 2018-02-05: qty 10

## 2018-02-05 MED ORDER — ENOXAPARIN SODIUM 40 MG/0.4ML ~~LOC~~ SOLN
40.0000 mg | SUBCUTANEOUS | Status: DC
  Administered 2018-02-05 – 2018-02-07 (×3): 40 mg via SUBCUTANEOUS
  Filled 2018-02-05 (×3): qty 0.4

## 2018-02-05 MED ORDER — ACETAMINOPHEN 650 MG RE SUPP: 650.0000 mg | Freq: Four times a day (QID) | RECTAL | Status: DC | PRN

## 2018-02-05 MED ORDER — LIDOCAINE-EPINEPHRINE-TETRACAINE (LET) SOLUTION
3.0000 mL | Freq: Once | NASAL | Status: AC
  Administered 2018-02-05: 3 mL via TOPICAL
  Filled 2018-02-05: qty 3

## 2018-02-05 MED ORDER — SODIUM CHLORIDE 0.9 % IV SOLN: INTRAVENOUS | Status: DC | PRN

## 2018-02-05 MED ORDER — VANCOMYCIN HCL 10 G IV SOLR
1250.0000 mg | INTRAVENOUS | Status: DC
  Administered 2018-02-05: 1250 mg via INTRAVENOUS
  Filled 2018-02-05: qty 1250

## 2018-02-05 MED ORDER — PRENATAL PLUS 27-1 MG PO TABS
1.0000 | ORAL_TABLET | Freq: Every day | ORAL | Status: DC
  Administered 2018-02-05 – 2018-02-07 (×3): 1 via ORAL
  Filled 2018-02-05 (×4): qty 1

## 2018-02-05 MED ORDER — INFLUENZA VAC SPLIT QUAD 0.5 ML IM SUSY
0.5000 mL | PREFILLED_SYRINGE | INTRAMUSCULAR | Status: AC
  Administered 2018-02-06: 0.5 mL via INTRAMUSCULAR

## 2018-02-05 MED ORDER — SODIUM CHLORIDE 0.9% FLUSH
3.0000 mL | Freq: Two times a day (BID) | INTRAVENOUS | Status: DC
  Administered 2018-02-07: 3 mL via INTRAVENOUS

## 2018-02-05 MED ORDER — IOPAMIDOL (ISOVUE-370) INJECTION 76%
100.0000 mL | Freq: Once | INTRAVENOUS | Status: AC | PRN
  Administered 2018-02-05: 100 mL via INTRAVENOUS

## 2018-02-05 MED ORDER — LIDOCAINE-EPINEPHRINE 1 %-1:100000 IJ SOLN
INTRAMUSCULAR | Status: AC
  Filled 2018-02-05: qty 1

## 2018-02-05 MED ORDER — ONDANSETRON HCL 4 MG PO TABS
4.0000 mg | ORAL_TABLET | Freq: Four times a day (QID) | ORAL | Status: DC | PRN
  Administered 2018-02-05: 4 mg via ORAL
  Filled 2018-02-05: qty 0.5

## 2018-02-05 MED ORDER — SODIUM CHLORIDE 0.9 % IV SOLN
INTRAVENOUS | Status: DC
  Administered 2018-02-05 (×2): via INTRAVENOUS

## 2018-02-05 MED ORDER — LIDOCAINE-EPINEPHRINE 2 %-1:100000 IJ SOLN
20.0000 mL | Freq: Once | INTRAMUSCULAR | Status: AC
  Administered 2018-02-05: 20 mL
  Filled 2018-02-05: qty 20

## 2018-02-05 MED ORDER — VANCOMYCIN HCL IN DEXTROSE 1-5 GM/200ML-% IV SOLN
1000.0000 mg | Freq: Once | INTRAVENOUS | Status: AC
  Administered 2018-02-05: 1000 mg via INTRAVENOUS
  Filled 2018-02-05: qty 200

## 2018-02-05 MED ORDER — CEFEPIME HCL 1 G IJ SOLR
INTRAMUSCULAR | Status: AC
  Filled 2018-02-05: qty 1

## 2018-02-05 MED ORDER — ALBUTEROL SULFATE (2.5 MG/3ML) 0.083% IN NEBU: 2.5000 mg | INHALATION_SOLUTION | Freq: Four times a day (QID) | RESPIRATORY_TRACT | Status: DC | PRN

## 2018-02-05 MED ORDER — SODIUM CHLORIDE 0.9 % IV SOLN
INTRAVENOUS | Status: DC | PRN
  Administered 2018-02-05: 250 mL via INTRAVENOUS

## 2018-02-05 MED ORDER — ONDANSETRON HCL 4 MG/2ML IJ SOLN: 4.0000 mg | Freq: Four times a day (QID) | INTRAMUSCULAR | Status: DC | PRN

## 2018-02-05 MED ORDER — ACETAMINOPHEN 325 MG PO TABS
650.0000 mg | ORAL_TABLET | Freq: Four times a day (QID) | ORAL | Status: DC | PRN
  Administered 2018-02-05 – 2018-02-07 (×3): 650 mg via ORAL
  Filled 2018-02-05 (×3): qty 2

## 2018-02-05 MED ORDER — SODIUM CHLORIDE 0.9 % IV SOLN
1.0000 g | Freq: Three times a day (TID) | INTRAVENOUS | Status: DC
  Administered 2018-02-05 – 2018-02-06 (×3): 1 g via INTRAVENOUS
  Filled 2018-02-05 (×5): qty 1

## 2018-02-05 MED ORDER — GUAIFENESIN ER 600 MG PO TB12
600.0000 mg | ORAL_TABLET | Freq: Two times a day (BID) | ORAL | Status: DC
  Administered 2018-02-05 – 2018-02-07 (×6): 600 mg via ORAL
  Filled 2018-02-05 (×6): qty 1

## 2018-02-05 MED ORDER — ZOLPIDEM TARTRATE 5 MG PO TABS
5.0000 mg | ORAL_TABLET | Freq: Once | ORAL | Status: AC
  Administered 2018-02-05: 5 mg via ORAL
  Filled 2018-02-05: qty 1

## 2018-02-05 NOTE — ED Provider Notes (Signed)
[facility] EMERGENCY DEPARTMENT Provider Note   CSN: [id number] Arrival date & time: 02/04/18  2255     History   Chief Complaint Chief Complaint  Patient presents with  . Back Pain    HPI Jessica Pugh is a 32 y.o. female.  HPI  This is a 32 year old female with no significant past medical history who presents with back pain.  Patient noted onset of left-sided back pain on Saturday.  She states that it is somewhat sharp in nature.  Worse with coughing and deep breaths.  Denies productive cough or fevers.  Denies hematuria, dysuria.  No history of kidney stones.  She states she has also now noted some right-sided mid back pain with similar nature and radiates to her right shoulder.  Currently she rates her pain 1 out of 10.  Denies any bowel or bladder difficulty, weakness, numbness, tingling of the lower extremities.  Denies any history of cancer or IV drug use.  Patient reports history of cystic acne.  Has noted a boil on her chest which she is unsure if this may be related.  She has noted drainage.  No significant redness.  Patient denies any history of blood clots, hormonal supplementation, leg swelling, recent travel, recent hospitalization.  Past Medical History:  Diagnosis Date  . History of chicken pox   . Pregnancy induced hypertension    first pregnancy    There are no active problems to display for this patient.   Past Surgical History:  Procedure Laterality Date  . NO PAST SURGERIES       OB History    Gravida  2   Para  2   Term  2   Preterm      AB      Living  2     SAB      TAB      Ectopic      Multiple      Live Births  2            Home Medications    Prior to Admission medications   Medication Sig Start Date End Date Taking? Authorizing Provider  acetaminophen (TYLENOL) 500 MG tablet Take 500 mg by mouth every 6 (six) hours as needed.      [provider]  prenatal vitamin w/FE, FA (PRENATAL 1 + 1) 27-1 MG  TABS Take 1 tablet by mouth daily.      [provider]    Family History No family history on file.  Social History Social History   Tobacco Use  . Smoking status: Never Smoker  Substance Use Topics  . Alcohol use: No  . Drug use: No     Allergies   Patient has no known allergies.   Review of Systems Review of Systems  Constitutional: Negative for chills and fever.  Respiratory: Positive for cough. Negative for shortness of breath.   Cardiovascular: Negative for chest pain.  Gastrointestinal: Negative for abdominal pain and nausea.  Genitourinary: Negative for dysuria and hematuria.  Musculoskeletal: Positive for back pain.  Neurological: Negative for weakness and numbness.  All other systems reviewed and are negative.    Physical Exam Updated Vital Signs BP (!) 148/72 (BP Location: Left Arm)   Pulse (!) 109   Temp 98.9 F (37.2 C) (Oral)   Resp 20   Ht 1.549 m (5\' 1" )   Wt 52.2 kg   LMP 01/14/2018   SpO2 98%   BMI 21.73 kg/m  Physical Exam  Constitutional: She is oriented to person, place, and time. She appears well-developed and well-nourished. No distress.  HENT:  Head: Normocephalic and atraumatic.  Eyes: Pupils are equal, round, and reactive to light.  Neck: Neck supple.  Cardiovascular: Regular rhythm and normal heart sounds.  No murmur heard. Tachycardia  Pulmonary/Chest: Effort normal and breath sounds normal. No respiratory distress. She has no wheezes.  Abdominal: Soft. Bowel sounds are normal. There is no tenderness.  Musculoskeletal:  No midline tenderness to palpation, step-off, deformity of the thoracic or lower lumbar spine, no paraspinous muscle tenderness  Neurological: She is alert and oriented to person, place, and time.  5 out of 5 strength in all 4 extremities  Skin: Skin is warm and dry.  Large area of fluctuance over the left anterior chest, slight surrounding erythema, actively draining  Psychiatric: She has a normal  mood and affect.  Nursing note and vitals reviewed.    ED Treatments / Results  Labs (all labs ordered are listed, but only abnormal results are displayed) Labs Reviewed  URINALYSIS, ROUTINE W REFLEX MICROSCOPIC - Abnormal; Notable for the following components:      Result Value   Hgb urine dipstick MODERATE (*)    All other components within normal limits  URINALYSIS, MICROSCOPIC (REFLEX) - Abnormal; Notable for the following components:   Bacteria, UA MANY (*)    All other components within normal limits  CBC WITH DIFFERENTIAL/PLATELET - Abnormal; Notable for the following components:   WBC 11.1 (*)    All other components within normal limits  BASIC METABOLIC PANEL - Abnormal; Notable for the following components:   Glucose, Bld 101 (*)    BUN <5 (*)    All other components within normal limits  D-DIMER, QUANTITATIVE (NOT AT [facility]) - Abnormal; Notable for the following components:   D-Dimer, Quant 0.60 (*)    All other components within normal limits  CULTURE, BLOOD (ROUTINE X 2)  CULTURE, BLOOD (ROUTINE X 2)  PREGNANCY, URINE  RAPID URINE DRUG SCREEN, HOSP PERFORMED  I-STAT CG4 LACTIC ACID, ED    EKG EKG Interpretation  Date/Time:  Tuesday February 05 2018 00:52:57 EDT Ventricular Rate:  89 PR Interval:    QRS Duration: 85 QT Interval:  343 QTC Calculation: 418 R Axis:   68 Text Interpretation:  Sinus rhythm Probable anteroseptal infarct, old Confirmed by Ross Marcus ([id number]) on 02/05/2018 1:32:39 AM   Radiology Ct Angio Chest Pe W And/or Wo Contrast  Result Date: 02/05/2018 CLINICAL DATA:  PE suspected, intermediate prob, positive D-dimer. Question nodule on radiograph. EXAM: CT ANGIOGRAPHY CHEST WITH CONTRAST TECHNIQUE: Multidetector CT imaging of the chest was performed using the standard protocol during bolus administration of intravenous contrast. Multiplanar CT image reconstructions and MIPs were obtained to evaluate the vascular anatomy. CONTRAST:   ISOVUE-370 IOPAMIDOL (ISOVUE-370) INJECTION 76% COMPARISON:  Chest and abdominal radiographs earlier this day. FINDINGS: Cardiovascular: There are no filling defects within the pulmonary arteries to the segmental level to suggest pulmonary embolus. Distal most subsegmental branches are not well assessed. Thoracic aorta is normal in caliber without dissection. Heart is normal in size. No pericardial effusion. Mediastinum/Nodes: No enlarged mediastinal or hilar lymph nodes. Prominent left axillary nodes measure up to 8 mm short axis. Large hypodense 3.2 cm nodule in the left lobe of the thyroid gland. The esophagus is decompressed. Lungs/Pleura: Multiple peripheral ill-defined and nodular opacities within both lungs primarily in the lower lobes, largest in the left lower lobe abuts the  pleura and accounts for radiographic abnormality. There is minimal surrounding ground-glass about these nodular densities. No pulmonary edema. Trachea and mainstem bronchi are patent. Bilateral pleural thickening without frank pleural effusion. Mild dependent atelectasis in the lower lobes. Upper Abdomen: No acute abnormality. Musculoskeletal: Skin thickening with ill-defined 2 cm subcutaneous density in the left anterior chest wall just off midline. No soft tissue air. There are no acute or suspicious osseous abnormalities. Review of the MIP images confirms the above findings. IMPRESSION: 1. No pulmonary embolus. 2. Multifocal pulmonary opacities in both lungs primarily peripherally with some ground-glass density. Peripheral distribution raises concern for septic emboli or pulmonary infarct, however no pulmonary embolus is visualized. Infectious etiology with multifocal pneumonia also considered. 3. Skin thickening and ill-defined 2 cm subcutaneous density in the left anterior chest wall suspicious for abscess. Prominent left axillary nodes are likely reactive. 4. Left thyroid nodule measuring 3.2 cm. Recommend elective nonemergent  thyroid ultrasound for characterization. These results were called by telephone at the time of interpretation on 02/05/2018 at 2:40 am to Dr. Ross Marcus , who verbally acknowledged these results. Electronically Signed   By: Narda Rutherford M.D.   On: 02/05/2018 02:41   Dg Abdomen Acute W/chest  Result Date: 02/05/2018 CLINICAL DATA:  Back and chest pain for a few days EXAM: DG ABDOMEN ACUTE W/ 1V CHEST COMPARISON:  None FINDINGS: Normal heart size, mediastinal contours, and pulmonary vascularity. Probable BILATERAL nipple shadows. Question pulmonary mass/nodule at lower LEFT chest 1.9 x 1.4 cm. Remaining lungs clear. No pleural effusion or pneumothorax. Nonobstructive bowel gas pattern. Scattered stool throughout colon. No bowel dilatation, bowel wall thickening, or free intraperitoneal air. No urinary tract calcification or acute osseous findings. IMPRESSION: Nonobstructive bowel gas pattern. Question nodular density lower LEFT chest 1.9 x 1.4 cm; noncontrast CT chest recommended to exclude pulmonary nodule. Normal bowel gas pattern Electronically Signed   By: Ulyses Southward M.D.   On: 02/05/2018 01:54    Procedures .Marland KitchenIncision and Drainage Date/Time: 02/05/2018 3:23 AM Performed by: Shon Baton, MD Authorized by: Shon Baton, MD   Consent:    Consent obtained:  Verbal   Consent given by:  Patient   Risks discussed:  Bleeding, incomplete drainage, pain and infection   Alternatives discussed:  No treatment Location:    Type:  Abscess   Size:  2 cm   Location:  Trunk   Trunk location:  Chest Pre-procedure details:    Skin preparation:  Betadine Anesthesia (see MAR for exact dosages):    Anesthesia method:  Local infiltration and topical application   Topical anesthetic:  LET   Local anesthetic:  Lidocaine 2% WITH epi Procedure type:    Complexity:  Simple Procedure details:    Needle aspiration: no     Incision types:  Stab incision   Incision depth:  Dermal   Scalpel  blade:  11   Wound management:  Probed and deloculated   Drainage:  Purulent   Drainage amount:  Moderate   Packing materials:  None Post-procedure details:    Patient tolerance of procedure:  Tolerated well, no immediate complications   (including critical care time)  CRITICAL CARE Performed by: Shon Baton   Total critical care time: 40 minutes  Critical care time was exclusive of separately billable procedures and treating other patients.  Critical care was necessary to treat or prevent imminent or life-threatening deterioration.  Critical care was time spent personally by me on the following activities: development of treatment plan  with patient and/or surrogate as well as nursing, discussions with consultants, evaluation of patient's response to treatment, examination of patient, obtaining history from patient or surrogate, ordering and performing treatments and interventions, ordering and review of laboratory studies, ordering and review of radiographic studies, pulse oximetry and re-evaluation of patient's condition.   Medications Ordered in ED Medications  lidocaine-EPINEPHrine (XYLOCAINE W/EPI) 2 %-1:100000 (with pres) injection 20 mL (has no administration in time range)  lidocaine-EPINEPHrine (XYLOCAINE W/EPI) 1 %-1:100000 (with pres) injection (has no administration in time range)  lidocaine-EPINEPHrine (XYLOCAINE W/EPI) 2 %-1:200000 (PF) injection (has no administration in time range)  vancomycin (VANCOCIN) IVPB 1000 mg/200 mL premix (has no administration in time range)  ceFEPIme (MAXIPIME) 1 g in sodium chloride 0.9 % 100 mL IVPB (has no administration in time range)  lidocaine-EPINEPHrine-tetracaine (LET) solution (3 mLs Topical Given 02/05/18 0101)  iopamidol (ISOVUE-370) 76 % injection 100 mL (100 mLs Intravenous Contrast Given 02/05/18 0209)     Initial Impression / Assessment and Plan / ED Course  I have reviewed the triage vital signs and the nursing  notes.  Pertinent labs & imaging results that were available during my care of the patient were reviewed by me and considered in my medical decision making (see chart for details).     Patient presents with back pain.  Also with skin abscess to the chest.  She is overall nontoxic-appearing.  Vital signs notable for mild tachycardia.  Her back pain is not classic.  It is almost pleuritic in nature.  She also has some radiation into her shoulder.  For this reason, basic lab work-up as well as acute abdominal series was obtained.  Urinalysis without obvious infection or significant hematuria.  D-dimer was also sent to screen for PE.  This was positive at 0.6.  Subsequent CT scan was obtained.  This does not show any pulmonary embolism but does show bilateral pulmonary opacities concerning for septic embolism.  Patient was requestion.  She adamantly denies IV drug use.  She denies any recent fevers or significant infectious symptoms.  Unclear etiology at this time.  However, given unknown source, feel it reasonable to add blood cultures and start IV antibiotics.  Patient likely needs an echocardiogram to rule out vegetation.  UDS was also added to her work-up as well as a TSH given incidental thyroid nodule noted on CT scan.  Patient's questions were answered.  We will plan for admission to the hospitalist.  She is clinically hemodynamically stable.  She does not appear to be septic.  Final Clinical Impressions(s) / ED Diagnoses   Final diagnoses:  Septic pulmonary embolism without acute cor pulmonale, unspecified chronicity (HCC)  Abscess  Thyroid nodule    ED Discharge Orders    None       Shon Baton, MD 02/05/18 [phone]

## 2018-02-05 NOTE — ED Notes (Signed)
Report attempted, RN unavailable at this time to get report, RN will call back for report.

## 2018-02-05 NOTE — Progress Notes (Signed)
Pharmacy Antibiotic Note  Jessica Pugh is a 32 y.o. female admitted on 02/05/2018 with bacteremia.  Pharmacy has been consulted for Vancomycin dosing.  Plan: Vancomycin 1gm iv x1, then 1250mg  iv q24hr  Goal AUC = 400 - 500 for all indications, except meningitis (goal AUC > 500 and Cmin 15-20 mcg/mL)   Height: 5\' 1"  (154.9 cm) Weight: 115 lb (52.2 kg) IBW/kg (Calculated) : 47.8  Temp (24hrs), Avg:98.7 F (37.1 C), Min:98.4 F (36.9 C), Max:98.9 F (37.2 C)  Recent Labs  Lab 02/05/18 0109 02/05/18 0326  WBC 11.1*  --   CREATININE 0.64  --   LATICACIDVEN  --  0.93    Estimated Creatinine Clearance: 75.5 mL/min (by C-G formula based on SCr of 0.64 mg/dL).    No Known Allergies  Antimicrobials this admission: Vancomycin 02/05/2018 >> Cefepime 02/05/2018 >>   Dose adjustments this admission: -  Microbiology results: -  Thank you for allowing pharmacy to be a part of this patient's care.  Aleene Davidson Crowford 02/05/2018 5:07 AM

## 2018-02-05 NOTE — Plan of Care (Signed)
[facility] transfer Ms. [place] is a 32 y/o pmh of cystic acne ; who presented with complaints of 3 days of back pain moving from left to right.  Patient noted to have a boil on left chest that was I&D and expressed pus.  Patient denied IV drug abuse.  Vital signs revealed heart rates in 109 bpm.  Labs revealed WBC 11.1 and d-dimer 0.6.  CT angiogram of the chest showed multi focal opacities concerning for septic emboli with left thyroid nodule.  UDS, TSH, and i-STAT lactic acid pending.  Blood cultures were obtained and patient was started on empiric antibiotics of vancomycin and cefepime.  [facility] called to admit for further work-up.  Admit to a telemetry bed as observation.

## 2018-02-05 NOTE — Progress Notes (Signed)
Resumed care of patient at 1500. Agree with prior RNs assessment. Will continue to monitor.  

## 2018-02-05 NOTE — Care Management Note (Signed)
Case Management Note  Patient Details  Name: Jessica Pugh MRN: [id number] Date of Birth: 1986-01-03  Subjective/Objective: Admitted w/PNA. From home w/spouse. No pcp, has insurance. Provided w/pcp listing(patient will make own pcp appt). No further CM needs.                   Action/Plan:d/c home.   Expected Discharge Date:                  Expected Discharge Plan:  Home/Self Care  In-House Referral:     Discharge planning Services  CM Consult  Post Acute Care Choice:    Choice offered to:     DME Arranged:    DME Agency:     HH Arranged:    HH Agency:     Status of Service:  In process, will continue to follow  If discussed at Long Length of Stay Meetings, dates discussed:    Additional Comments:  Lanier Clam, RN 02/05/2018, 4:13 PM

## 2018-02-05 NOTE — Progress Notes (Signed)
  Echocardiogram 2D Echocardiogram has been performed.  Jessica Pugh F 02/05/2018, 1:52 PM

## 2018-02-05 NOTE — Progress Notes (Signed)
Patient admitted earlier today by Dr. Madelyn Flavors. See H&P for details.  32 year old female with a history of cystic acne presented with back pain that started approximately 3 days ago.  She also is noted to have a small abscess on her chest which was I&D need a couple of days ago.  Patient did for multifocal pneumonia versus possible septic emboli given CT findings.  Blood cultures currently pending.  Currently on vancomycin and cefepime.  Echocardiogram pending.  Briefly discussed with infectious disease, would certainly have general surgery look at chest abscess.  Patient was also noted to have a left thyroid nodule which is about 3.2 cm which was found on CT which will need outpatient follow-up.  Time spent: 25 minutes  Shaneese Tait D.O. [facility] Pager [phone]  If 7PM-7AM, please contact night-coverage [email or web address] Password [facility] 02/05/2018, 12:48 PM

## 2018-02-05 NOTE — H&P (Addendum)
History and Physical    Jessica Pugh ZOX:[id number] DOB: March 09, 1986 DOA: 02/05/2018  Referring MD/NP/PA: Ross Marcus, MD PCP: Patient, No Pcp Per  Patient coming from: [facility] transfer  Chief Complaint: Back pain  I have personally briefly reviewed patient's old medical records in [facility]   HPI: Jessica Pugh is a 32 y.o. female with  medical history significant of cystic acne; who presents with complaints of back pain over the last 3 days.  She describes the pain as sharp in nature and initially located on the left side.  However, symptoms moved to the right mid back and and shoulder area.  Taking a deep inspiratory breath seemed to worsen pain.  Associated symptoms included mild sinus congestion that she related to allergies, scratchy throat, and boil of the left anterior chest with swelling and pus drainage which started approximately 4 days ago.  Normally patient reports the symptoms would resolve on their own.  Denies any fevers, productive cough, nausea, vomiting, diarrhea, prolonged immobilization, leg swelling, or calf pain.   Patient notes that she had recently gone tubing in [place] back in July, and helped clear debris from falling trees.  ED Course: Upon admission to the emergency department patient was noted to be have heart rates elevated up to 109, and all other vital signs maintained. Labs revealed WBC 11.1 and d-dimer 0.6.  CT angiogram of the chest showed multi focal opacities concerning for septic emboli or multifocal pneumonia with left thyroid nodule showing 3.2 cm    Blood cultures were obtained and patient was started on empiric antibiotics of vancomycin and cefepime.  TRH called to admit for further work-up.  Admit to a telemetry bed as observation. Drug screen was noted to be negative, and i-STAT lactic acid 0.93, and TSH was pending.  Review of Systems  Constitutional: Negative for diaphoresis and fever.  HENT: Positive for congestion and sore throat.   Eyes:  Negative for photophobia and pain.  Respiratory: Negative for cough and shortness of breath.   Cardiovascular: Negative for chest pain, leg swelling and PND.  Gastrointestinal: Negative for abdominal pain, diarrhea, nausea and vomiting.  Genitourinary: Negative for dysuria and hematuria.  Musculoskeletal: Positive for back pain. Negative for falls.  Skin:       Positive for abscess  Neurological: Negative for focal weakness and loss of consciousness.  Endo/Heme/Allergies: Positive for environmental allergies.  Psychiatric/Behavioral: Negative for memory loss and substance abuse.    Past Medical History:  Diagnosis Date  . History of chicken pox   . Pregnancy induced hypertension    first pregnancy    Past Surgical History:  Procedure Laterality Date  . NO PAST SURGERIES       reports that she has never smoked. She does not have any smokeless tobacco history on file. She reports that she does not drink alcohol or use drugs.  No Known Allergies  Family History  Problem Relation Age of Onset  . Kidney Stones Mother   . Kidney Stones Father   . Prostate cancer Father     Prior to Admission medications   Medication Sig Start Date End Date Taking? Authorizing Provider  acetaminophen (TYLENOL) 500 MG tablet Take 500 mg by mouth every 6 (six) hours as needed.      [provider]  prenatal vitamin w/FE, FA (PRENATAL 1 + 1) 27-1 MG TABS Take 1 tablet by mouth daily.      [provider]    Physical Exam:  Constitutional: NAD, calm,  comfortable Vitals:   02/04/18 2302  BP: (!) 148/72  Pulse: (!) 109  Resp: 20  Temp: 98.9 F (37.2 C)  TempSrc: Oral  SpO2: 98%  Weight: 52.2 kg  Height: 5\' 1"  (1.549 m)   Eyes: PERRL, lids and conjunctivae normal ENMT: Mucous membranes are moist. Posterior pharynx clear of any exudate or lesions. Normal dentition.  Neck: normal, supple, no masses, no thyromegaly Respiratory: clear to auscultation bilaterally, no  wheezing, no crackles. Normal respiratory effort. No accessory muscle use.  Cardiovascular: Tachycardic, no murmurs / rubs / gallops. No extremity edema. 2+ pedal pulses. No carotid bruits.  Abdomen: no tenderness, no masses palpated. No hepatosplenomegaly. Bowel sounds positive.  Musculoskeletal: no clubbing / cyanosis. No joint deformity upper and lower extremities. Good ROM, no contractures. Normal muscle tone.  Skin: no rashes, lesions, ulcers. No induration Neurologic: CN 2-12 grossly intact. Sensation intact, DTR normal. Strength 5/5 in all 4.  Psychiatric: Normal judgment and insight. Alert and oriented x 3. Normal mood.     Labs on Admission: I have personally reviewed following labs and imaging studies  CBC: Recent Labs  Lab 02/05/18 0109  WBC 11.1*  NEUTROABS 8.4  HGB 12.4  HCT 36.3  MCV 93.1  PLT 176   Basic Metabolic Panel: Recent Labs  Lab 02/05/18 0109  NA 139  K 3.7  CL 106  CO2 24  GLUCOSE 101*  BUN <5*  CREATININE 0.64  CALCIUM 9.1   GFR: Estimated Creatinine Clearance: 75.5 mL/min (by C-G formula based on SCr of 0.64 mg/dL). Liver Function Tests: No results for input(s): AST, ALT, ALKPHOS, BILITOT, PROT, ALBUMIN in the last 168 hours. No results for input(s): LIPASE, AMYLASE in the last 168 hours. No results for input(s): AMMONIA in the last 168 hours. Coagulation Profile: No results for input(s): INR, PROTIME in the last 168 hours. Cardiac Enzymes: No results for input(s): CKTOTAL, CKMB, CKMBINDEX, TROPONINI in the last 168 hours. BNP (last 3 results) No results for input(s): PROBNP in the last 8760 hours. HbA1C: No results for input(s): HGBA1C in the last 72 hours. CBG: No results for input(s): GLUCAP in the last 168 hours. Lipid Profile: No results for input(s): CHOL, HDL, LDLCALC, TRIG, CHOLHDL, LDLDIRECT in the last 72 hours. Thyroid Function Tests: No results for input(s): TSH, T4TOTAL, FREET4, T3FREE, THYROIDAB in the last 72  hours. Anemia Panel: No results for input(s): VITAMINB12, FOLATE, FERRITIN, TIBC, IRON, RETICCTPCT in the last 72 hours. Urine analysis:    Component Value Date/Time   COLORURINE YELLOW 02/04/2018 2303   APPEARANCEUR CLEAR 02/04/2018 2303   LABSPEC 1.020 02/04/2018 2303   PHURINE 5.5 02/04/2018 2303   GLUCOSEU NEGATIVE 02/04/2018 2303   HGBUR MODERATE (A) 02/04/2018 2303   BILIRUBINUR NEGATIVE 02/04/2018 2303   KETONESUR NEGATIVE 02/04/2018 2303   PROTEINUR NEGATIVE 02/04/2018 2303   NITRITE NEGATIVE 02/04/2018 2303   LEUKOCYTESUR NEGATIVE 02/04/2018 2303   Sepsis Labs: No results found for this or any previous visit (from the past 240 hour(s)).   Radiological Exams on Admission: Ct Angio Chest Pe W And/or Wo Contrast  Result Date: 02/05/2018 CLINICAL DATA:  PE suspected, intermediate prob, positive D-dimer. Question nodule on radiograph. EXAM: CT ANGIOGRAPHY CHEST WITH CONTRAST TECHNIQUE: Multidetector CT imaging of the chest was performed using the standard protocol during bolus administration of intravenous contrast. Multiplanar CT image reconstructions and MIPs were obtained to evaluate the vascular anatomy. CONTRAST:  ISOVUE-370 IOPAMIDOL (ISOVUE-370) INJECTION 76% COMPARISON:  Chest and abdominal radiographs earlier this day.  FINDINGS: Cardiovascular: There are no filling defects within the pulmonary arteries to the segmental level to suggest pulmonary embolus. Distal most subsegmental branches are not well assessed. Thoracic aorta is normal in caliber without dissection. Heart is normal in size. No pericardial effusion. Mediastinum/Nodes: No enlarged mediastinal or hilar lymph nodes. Prominent left axillary nodes measure up to 8 mm short axis. Large hypodense 3.2 cm nodule in the left lobe of the thyroid gland. The esophagus is decompressed. Lungs/Pleura: Multiple peripheral ill-defined and nodular opacities within both lungs primarily in the lower lobes, largest in the left  lower lobe abuts the pleura and accounts for radiographic abnormality. There is minimal surrounding ground-glass about these nodular densities. No pulmonary edema. Trachea and mainstem bronchi are patent. Bilateral pleural thickening without frank pleural effusion. Mild dependent atelectasis in the lower lobes. Upper Abdomen: No acute abnormality. Musculoskeletal: Skin thickening with ill-defined 2 cm subcutaneous density in the left anterior chest wall just off midline. No soft tissue air. There are no acute or suspicious osseous abnormalities. Review of the MIP images confirms the above findings. IMPRESSION: 1. No pulmonary embolus. 2. Multifocal pulmonary opacities in both lungs primarily peripherally with some ground-glass density. Peripheral distribution raises concern for septic emboli or pulmonary infarct, however no pulmonary embolus is visualized. Infectious etiology with multifocal pneumonia also considered. 3. Skin thickening and ill-defined 2 cm subcutaneous density in the left anterior chest wall suspicious for abscess. Prominent left axillary nodes are likely reactive. 4. Left thyroid nodule measuring 3.2 cm. Recommend elective nonemergent thyroid ultrasound for characterization. These results were called by telephone at the time of interpretation on 02/05/2018 at 2:40 am to Dr. Ross Marcus , who verbally acknowledged these results. Electronically Signed   By: Narda Rutherford M.D.   On: 02/05/2018 02:41   Dg Abdomen Acute W/chest  Result Date: 02/05/2018 CLINICAL DATA:  Back and chest pain for a few days EXAM: DG ABDOMEN ACUTE W/ 1V CHEST COMPARISON:  None FINDINGS: Normal heart size, mediastinal contours, and pulmonary vascularity. Probable BILATERAL nipple shadows. Question pulmonary mass/nodule at lower LEFT chest 1.9 x 1.4 cm. Remaining lungs clear. No pleural effusion or pneumothorax. Nonobstructive bowel gas pattern. Scattered stool throughout colon. No bowel dilatation, bowel wall  thickening, or free intraperitoneal air. No urinary tract calcification or acute osseous findings. IMPRESSION: Nonobstructive bowel gas pattern. Question nodular density lower LEFT chest 1.9 x 1.4 cm; noncontrast CT chest recommended to exclude pulmonary nodule. Normal bowel gas pattern Electronically Signed   By: Ulyses Southward M.D.   On: 02/05/2018 01:54    EKG: Independently reviewed.  Sinus rhythm at 89 bpm   Assessment/Plan Multifocal pneumonia vs. septic emboli: Acute.  Patient presented with complaints of back pain found to have elevated d-dimer 0.6.  CT angiogram of the chest showing multiple old pulmonary opacities concerning for septic emboli or multifocal pneumonia. - Admit to a telemetry bed - Follow-up blood cultures - Check echocardiogram - Continue empiric antibiotics of vancomycin and cefepime - May warrant consultation to ID in a.m.  Abscess of left chest wall: Acute.  Patient status post I&D in the emergency department.  Leukocytosis.  WBC elevated at 11.1.  Suspect secondary to above. - Recheck CBC in a.m.  Possible urinary tract infection: Acute.  Urinalysis was positive for many bacteria, moderate hemoglobin, 05 squamous epithelial cells, and 0-5 WBCs. - Check urine culture  Left thyroid nodule: Patient found to have a 3.2 cm left lower nodule incidentally on CT scan. - Follow-up TSH  DVT prophylaxis: Lovenox Code Status: Full Family Communication: This plan of care with the patient and family present at bedside Disposition Plan: Likely discharge home once medically stable Consults called: none Admission status: observation  Clydie Braun MD [facility] Pager [phone]   If 7PM-7AM, please contact night-coverage [email or web address] Password [email or web address]  02/05/2018, 4:44 AM

## 2018-02-06 ENCOUNTER — Observation Stay (HOSPITAL_COMMUNITY): Payer: 59

## 2018-02-06 ENCOUNTER — Encounter (HOSPITAL_COMMUNITY): Payer: Self-pay | Admitting: General Surgery

## 2018-02-06 DIAGNOSIS — L700 Acne vulgaris: Secondary | ICD-10-CM | POA: Diagnosis present

## 2018-02-06 DIAGNOSIS — F419 Anxiety disorder, unspecified: Secondary | ICD-10-CM | POA: Diagnosis present

## 2018-02-06 DIAGNOSIS — R7881 Bacteremia: Secondary | ICD-10-CM

## 2018-02-06 DIAGNOSIS — D649 Anemia, unspecified: Secondary | ICD-10-CM | POA: Diagnosis present

## 2018-02-06 DIAGNOSIS — M549 Dorsalgia, unspecified: Secondary | ICD-10-CM

## 2018-02-06 DIAGNOSIS — R091 Pleurisy: Secondary | ICD-10-CM | POA: Diagnosis present

## 2018-02-06 DIAGNOSIS — J188 Other pneumonia, unspecified organism: Secondary | ICD-10-CM | POA: Diagnosis not present

## 2018-02-06 DIAGNOSIS — Z8042 Family history of malignant neoplasm of prostate: Secondary | ICD-10-CM | POA: Diagnosis not present

## 2018-02-06 DIAGNOSIS — Z23 Encounter for immunization: Secondary | ICD-10-CM | POA: Diagnosis not present

## 2018-02-06 DIAGNOSIS — I351 Nonrheumatic aortic (valve) insufficiency: Secondary | ICD-10-CM | POA: Diagnosis present

## 2018-02-06 DIAGNOSIS — L02213 Cutaneous abscess of chest wall: Secondary | ICD-10-CM | POA: Diagnosis present

## 2018-02-06 DIAGNOSIS — Z841 Family history of disorders of kidney and ureter: Secondary | ICD-10-CM | POA: Diagnosis not present

## 2018-02-06 DIAGNOSIS — E041 Nontoxic single thyroid nodule: Secondary | ICD-10-CM | POA: Diagnosis present

## 2018-02-06 DIAGNOSIS — J189 Pneumonia, unspecified organism: Secondary | ICD-10-CM | POA: Diagnosis present

## 2018-02-06 DIAGNOSIS — I2690 Septic pulmonary embolism without acute cor pulmonale: Secondary | ICD-10-CM | POA: Diagnosis present

## 2018-02-06 DIAGNOSIS — B9561 Methicillin susceptible Staphylococcus aureus infection as the cause of diseases classified elsewhere: Secondary | ICD-10-CM

## 2018-02-06 LAB — BLOOD CULTURE ID PANEL (REFLEXED)
ACINETOBACTER BAUMANNII: NOT DETECTED
CANDIDA KRUSEI: NOT DETECTED
Candida albicans: NOT DETECTED
Candida glabrata: NOT DETECTED
Candida parapsilosis: NOT DETECTED
Candida tropicalis: NOT DETECTED
ENTEROCOCCUS SPECIES: NOT DETECTED
ESCHERICHIA COLI: NOT DETECTED
Enterobacter cloacae complex: NOT DETECTED
Enterobacteriaceae species: NOT DETECTED
Haemophilus influenzae: NOT DETECTED
Klebsiella oxytoca: NOT DETECTED
Klebsiella pneumoniae: NOT DETECTED
LISTERIA MONOCYTOGENES: NOT DETECTED
Methicillin resistance: NOT DETECTED
NEISSERIA MENINGITIDIS: NOT DETECTED
PSEUDOMONAS AERUGINOSA: NOT DETECTED
Proteus species: NOT DETECTED
SERRATIA MARCESCENS: NOT DETECTED
STAPHYLOCOCCUS SPECIES: DETECTED — AB
STREPTOCOCCUS AGALACTIAE: NOT DETECTED
STREPTOCOCCUS PNEUMONIAE: NOT DETECTED
STREPTOCOCCUS SPECIES: NOT DETECTED
Staphylococcus aureus (BCID): DETECTED — AB
Streptococcus pyogenes: NOT DETECTED

## 2018-02-06 LAB — RAPID URINE DRUG SCREEN, HOSP PERFORMED
Amphetamines: NOT DETECTED
BENZODIAZEPINES: NOT DETECTED
Barbiturates: NOT DETECTED
Cocaine: NOT DETECTED
OPIATES: NOT DETECTED
TETRAHYDROCANNABINOL: NOT DETECTED

## 2018-02-06 LAB — BASIC METABOLIC PANEL
ANION GAP: 8 (ref 5–15)
BUN: 6 mg/dL (ref 6–20)
CALCIUM: 8.7 mg/dL — AB (ref 8.9–10.3)
CO2: 24 mmol/L (ref 22–32)
Chloride: 108 mmol/L (ref 98–111)
Creatinine, Ser: 0.66 mg/dL (ref 0.44–1.00)
GLUCOSE: 90 mg/dL (ref 70–99)
Potassium: 4.1 mmol/L (ref 3.5–5.1)
Sodium: 140 mmol/L (ref 135–145)

## 2018-02-06 LAB — CBC
HCT: 31.9 % — ABNORMAL LOW (ref 36.0–46.0)
Hemoglobin: 10.8 g/dL — ABNORMAL LOW (ref 12.0–15.0)
MCH: 31.8 pg (ref 26.0–34.0)
MCHC: 33.9 g/dL (ref 30.0–36.0)
MCV: 93.8 fL (ref 78.0–100.0)
PLATELETS: 178 10*3/uL (ref 150–400)
RBC: 3.4 MIL/uL — AB (ref 3.87–5.11)
RDW: 13.1 % (ref 11.5–15.5)
WBC: 6.3 10*3/uL (ref 4.0–10.5)

## 2018-02-06 LAB — URINE CULTURE: Culture: NO GROWTH

## 2018-02-06 LAB — LEGIONELLA PNEUMOPHILA SEROGP 1 UR AG: L. pneumophila Serogp 1 Ur Ag: NEGATIVE

## 2018-02-06 MED ORDER — CEFAZOLIN SODIUM-DEXTROSE 2-4 GM/100ML-% IV SOLN
2.0000 g | Freq: Three times a day (TID) | INTRAVENOUS | Status: DC
  Administered 2018-02-06 – 2018-02-08 (×7): 2 g via INTRAVENOUS
  Filled 2018-02-06 (×8): qty 100

## 2018-02-06 MED ORDER — ZOLPIDEM TARTRATE 5 MG PO TABS
5.0000 mg | ORAL_TABLET | Freq: Every evening | ORAL | Status: DC | PRN
  Administered 2018-02-06 – 2018-02-07 (×2): 5 mg via ORAL
  Filled 2018-02-06 (×2): qty 1

## 2018-02-06 NOTE — Progress Notes (Signed)
[facility]  [facility] Infusion Coordinator will follow pt with ID team to support home IV  ABX at DC as ordered.  If patient discharges after hours, please call [phone].   Jessica Pugh 02/06/2018, 4:54 PM

## 2018-02-06 NOTE — Progress Notes (Signed)
PHARMACY - PHYSICIAN COMMUNICATION CRITICAL VALUE ALERT - BLOOD CULTURE IDENTIFICATION (BCID)  Evalina Swaz[place]and is an 32 y.o. female who presented to [facility] on 02/05/2018 with a chief complaint of Chest wall abscess  Assessment:  Patient with Staphylococcus aureus detected.  But per MD notes, ID and general surgery may still look at patient.  (include suspected source if known)  Name of physician (or Provider) Contacted: K. Schorr  Current antibiotics: Vancomycin, cefepime   Changes to prescribed antibiotics recommended:  No change at this time (<24hr antibiotics, awaiting additional consults).  However, if change was desired consider narrowing to Cefazolin 2gm iv q8hr.  Results for orders placed or performed during the hospital encounter of 02/05/18  Blood Culture ID Panel (Reflexed) (Collected: 02/05/2018  3:01 AM)  Result Value Ref Range   Enterococcus species NOT DETECTED NOT DETECTED   Listeria monocytogenes NOT DETECTED NOT DETECTED   Staphylococcus species DETECTED (A) NOT DETECTED   Staphylococcus aureus DETECTED (A) NOT DETECTED   Methicillin resistance NOT DETECTED NOT DETECTED   Streptococcus species NOT DETECTED NOT DETECTED   Streptococcus agalactiae NOT DETECTED NOT DETECTED   Streptococcus pneumoniae NOT DETECTED NOT DETECTED   Streptococcus pyogenes NOT DETECTED NOT DETECTED   Acinetobacter baumannii NOT DETECTED NOT DETECTED   Enterobacteriaceae species NOT DETECTED NOT DETECTED   Enterobacter cloacae complex NOT DETECTED NOT DETECTED   Escherichia coli NOT DETECTED NOT DETECTED   Klebsiella oxytoca NOT DETECTED NOT DETECTED   Klebsiella pneumoniae NOT DETECTED NOT DETECTED   Proteus species NOT DETECTED NOT DETECTED   Serratia marcescens NOT DETECTED NOT DETECTED   Haemophilus influenzae NOT DETECTED NOT DETECTED   Neisseria meningitidis NOT DETECTED NOT DETECTED   Pseudomonas aeruginosa NOT DETECTED NOT DETECTED   Candida albicans NOT DETECTED NOT DETECTED   Candida glabrata NOT DETECTED NOT DETECTED   Candida krusei NOT DETECTED NOT DETECTED   Candida parapsilosis NOT DETECTED NOT DETECTED   Candida tropicalis NOT DETECTED NOT DETECTED    Aleene Davidson Crowford 02/06/2018  1:00 AM

## 2018-02-06 NOTE — Progress Notes (Signed)
PROGRESS NOTE    Jessica Pugh  QXI:[id number] DOB: 1986-03-19 DOA: 02/05/2018 PCP: Patient, No Pcp Per    Brief Narrative:31 y.o. female with  medical history significant of cystic acne; who presents with complaints of back pain over the last 3 days.  She describes the pain as sharp in nature and initially located on the left side.  However, symptoms moved to the right mid back and and shoulder area.  Taking a deep inspiratory breath seemed to worsen pain.  Associated symptoms included mild sinus congestion that she related to allergies, scratchy throat, and boil of the left anterior chest with swelling and pus drainage which started approximately 4 days ago.  Normally patient reports the symptoms would resolve on their own.  Denies any fevers, productive cough, nausea, vomiting, diarrhea, prolonged immobilization, leg swelling, or calf pain.   Patient notes that she had recently gone tubing in [place] back in July, and helped clear debris from falling trees.  ED Course: Upon admission to the emergency department patient was noted to be have heart rates elevated up to 109, and all other vital signs maintained.Labs revealed WBC 11.1 and d-dimer 0.6. CT angiogram of the chest showed multi focal opacities concerning for septic emboli or multifocal pneumonia with left thyroid nodule showing 3.2 cm   Blood cultures were obtained and patient was started on empiric antibiotics of vancomycin and cefepime. TRH called to admit for further work-up. Admit to a telemetry bed as observation. Drug screen was noted to be negative, and i-STAT lactic acid 0.93, and TSH was pending.   Assessment & Plan:   Principal Problem:   Bacteremia due to methicillin susceptible Staphylococcus aureus (MSSA) Active Problems:   Multifocal pneumonia   Leukocytosis   Left thyroid nodule   Abscess of chest wall   Septic embolism (HCC)   Aortic regurgitation   Normocytic anemia  1] possible multifocal pneumonia versus  septic emboli-she presented with complaints of back pain and was found to have this bilateral opacities in lung fields.  She denies any cough shortness of breath nausea vomiting or fever.  She is currently on room air oxygen saturation.  Her saturations above 92%.  Work-up so far included an echocardiogram show Aortic regurgitaton.will order TEE.  Patient had an abscess on the front of her chest which was incised and drained in the ER.  surgery was consulted today to reevaluate the abscess site appreciate their input they recommend no further surgical intervention for the Actos at this time.  Continue with wet-to-dry dressing twice a day.  Her blood cultures came back positive for M SSA.  His antibiotics has been changed to Ancef by infectious disease Dr. Orvan Falconer.  dw with pulmonary Dr. Marchelle Gearing regarding the reading of pulmonary infarct on chest CT he recommended to check ANCA antibodies and ANCA titers and antiglomerular basement membrane antibodies.  He did not recommend any treatment for this pulmonary infarct at this time.  Needs a follow-up CT scan as an out patient   He was also noted to have a left thyroid nodule 3.2 cm which needs to be followed up as an outpatient.  TSH is normal at 2.12.  Denies any use of IV drugs use of vaping alcohol or being with sick contacts or travel.   DVT prophylaxis: Lovenox Code Status: Code family Communication this with mother who was in the room Disposition Plan:  Discharge once medically stable consultants: General surgery, infectious disease over the phone  Procedures: None Antimicrobials: Ancef subjective: She feels  well after IV antibiotics have been started.  She denies any shortness of breath chest pain or cough.   Objective: Vitals:   02/05/18 0606 02/05/18 1224 02/05/18 2052 02/06/18 0525  BP:  114/76 116/62 (!) 112/55  Pulse:  67 64 73  Resp:  15 18 14   Temp:  98.6 F (37 C) 98.6 F (37 C) 98.5 F (36.9 C)  TempSrc:  Oral Oral Oral  SpO2:   100% 97% 98%  Weight: 52.6 kg     Height: 5\' 1"  (1.549 m)       Intake/Output Summary (Last 24 hours) at 02/06/2018 1251 Last data filed at 02/06/2018 0600 Gross per 24 hour  Intake 1735.74 ml  Output 1000 ml  Net 735.74 ml   Filed Weights   02/04/18 2302 02/05/18 0606  Weight: 52.2 kg 52.6 kg    Examination:  General exam: Appears calm and comfortable  Respiratory system: CLEAR  to auscultation. Respiratory effort normal. Cardiovascular system: S1 & S2 heard, RRR. No JVD, murmurs, rubs, gallops or clicks. No pedal edema. Gastrointestinal system: Abdomen is nondistended, soft and nontender. No organomegaly or masses felt. Normal bowel sounds heard. Central nervous system: Alert and oriented. No focal neurological deficits. Extremities: Symmetric 5 x 5 power. Skin: No rashes, lesions or ulcers Psychiatry: Judgement and insight appear normal. Mood & affect appropriate.     Data Reviewed: I have personally reviewed following labs and imaging studies  CBC: Recent Labs  Lab 02/05/18 0109 02/05/18 0528 02/06/18 0552  WBC 11.1* 11.9* 6.3  NEUTROABS 8.4  --   --   HGB 12.4 11.9* 10.8*  HCT 36.3 35.2* 31.9*  MCV 93.1 93.1 93.8  PLT 176 203 178   Basic Metabolic Panel: Recent Labs  Lab 02/05/18 0109 02/05/18 0528 02/06/18 0552  NA 139 139 140  K 3.7 3.7 4.1  CL 106 106 108  CO2 24 23 24   GLUCOSE 101* 138* 90  BUN <5* <5* 6  CREATININE 0.64 0.66 0.66  CALCIUM 9.1 8.9 8.7*   GFR: Estimated Creatinine Clearance: 75.5 mL/min (by C-G formula based on SCr of 0.66 mg/dL). Liver Function Tests: No results for input(s): AST, ALT, ALKPHOS, BILITOT, PROT, ALBUMIN in the last 168 hours. No results for input(s): LIPASE, AMYLASE in the last 168 hours. No results for input(s): AMMONIA in the last 168 hours. Coagulation Profile: No results for input(s): INR, PROTIME in the last 168 hours. Cardiac Enzymes: No results for input(s): CKTOTAL, CKMB, CKMBINDEX, TROPONINI in the  last 168 hours. BNP (last 3 results) No results for input(s): PROBNP in the last 8760 hours. HbA1C: No results for input(s): HGBA1C in the last 72 hours. CBG: No results for input(s): GLUCAP in the last 168 hours. Lipid Profile: No results for input(s): CHOL, HDL, LDLCALC, TRIG, CHOLHDL, LDLDIRECT in the last 72 hours. Thyroid Function Tests: Recent Labs    02/05/18 0345  TSH 2.128   Anemia Panel: No results for input(s): VITAMINB12, FOLATE, FERRITIN, TIBC, IRON, RETICCTPCT in the last 72 hours. Sepsis Labs: Recent Labs  Lab 02/05/18 0326  LATICACIDVEN 0.93    Recent Results (from the past 240 hour(s))  Culture, Urine     Status: None   Collection Time: 02/04/18 11:03 PM  Result Value Ref Range Status   Specimen Description URINE, RANDOM  Final   Special Requests   Final    NONE Performed at [facility], [address] [place], [place] [address]    Culture NO GROWTH  Final   Report Status 02/06/2018 FINAL  Final  Blood culture (routine x 2)     Status: Abnormal (Preliminary result)   Collection Time: 02/05/18  3:01 AM  Result Value Ref Range Status   Specimen Description BLOOD RIGHT ARM  Final   Special Requests   Final    BOTTLES DRAWN AEROBIC AND ANAEROBIC Blood Culture adequate volume Performed at [facility], [address] [place], [place] [address]    Culture  Setup Time   Final    IN BOTH AEROBIC AND ANAEROBIC BOTTLES GRAM POSITIVE COCCI CRITICAL RESULT CALLED TO, READ BACK BY AND VERIFIED WITHPeggyann Juba PHARMD 2359 02/05/18 A BROWNING    Culture (A)  Final    STAPHYLOCOCCUS AUREUS SUSCEPTIBILITIES TO FOLLOW    Report Status PENDING  Incomplete  Blood Culture ID Panel (Reflexed)     Status: Abnormal   Collection Time: 02/05/18  3:01 AM  Result Value Ref Range Status   Enterococcus species NOT DETECTED NOT DETECTED Final   Listeria monocytogenes NOT DETECTED NOT DETECTED Final   Staphylococcus species DETECTED (A) NOT DETECTED  Final    Comment: CRITICAL RESULT CALLED TO, READ BACK BY AND VERIFIED WITH: Peggyann Juba PHARMD 2359 02/05/18 A BROWNING    Staphylococcus aureus DETECTED (A) NOT DETECTED Final    Comment: Methicillin (oxacillin) susceptible Staphylococcus aureus (MSSA). Preferred therapy is anti staphylococcal beta lactam antibiotic (Cefazolin or Nafcillin), unless clinically contraindicated. CRITICAL RESULT CALLED TO, READ BACK BY AND VERIFIED WITH: Peggyann Juba PHARMD [id number] 02/05/18 A BROWNING    Methicillin resistance NOT DETECTED NOT DETECTED Final   Streptococcus species NOT DETECTED NOT DETECTED Final   Streptococcus agalactiae NOT DETECTED NOT DETECTED Final   Streptococcus pneumoniae NOT DETECTED NOT DETECTED Final   Streptococcus pyogenes NOT DETECTED NOT DETECTED Final   Acinetobacter baumannii NOT DETECTED NOT DETECTED Final   Enterobacteriaceae species NOT DETECTED NOT DETECTED Final   Enterobacter cloacae complex NOT DETECTED NOT DETECTED Final   Escherichia coli NOT DETECTED NOT DETECTED Final   Klebsiella oxytoca NOT DETECTED NOT DETECTED Final   Klebsiella pneumoniae NOT DETECTED NOT DETECTED Final   Proteus species NOT DETECTED NOT DETECTED Final   Serratia marcescens NOT DETECTED NOT DETECTED Final   Haemophilus influenzae NOT DETECTED NOT DETECTED Final   Neisseria meningitidis NOT DETECTED NOT DETECTED Final   Pseudomonas aeruginosa NOT DETECTED NOT DETECTED Final   Candida albicans NOT DETECTED NOT DETECTED Final   Candida glabrata NOT DETECTED NOT DETECTED Final   Candida krusei NOT DETECTED NOT DETECTED Final   Candida parapsilosis NOT DETECTED NOT DETECTED Final   Candida tropicalis NOT DETECTED NOT DETECTED Final    Comment: Performed at [facility], [address], [place], [place] [address]  Blood culture (routine x 2)     Status: Abnormal (Preliminary result)   Collection Time: 02/05/18  3:17 AM  Result Value Ref Range Status   Specimen Description   Final    BLOOD  RIGHT HAND Performed at [facility], [address] [place], [place] [address]    Special Requests   Final    BOTTLES DRAWN AEROBIC AND ANAEROBIC Blood Culture adequate volume Performed at [facility], [address] [place], [place] [address]    Culture  Setup Time   Final    GRAM POSITIVE COCCI ANAEROBIC BOTTLE ONLY CRITICAL RESULT CALLED TO, READ BACK BY AND VERIFIED WITHPeggyann Juba PHARMD 2130 02/05/18 A  BROWNING Performed at [facility], [address], [place], [place] [address]    Culture STAPHYLOCOCCUS AUREUS (A)  Final   Report Status PENDING  Incomplete         Radiology Studies: Dg Chest 1 View  Result Date: 02/06/2018 CLINICAL DATA:  Pneumonia EXAM: CHEST  1 VIEW COMPARISON:  Chest CT 02/05/2018 FINDINGS: 1003 hours. AP portable exam shows no focal lung consolidation. No pulmonary edema or pleural effusion. The cardiopericardial silhouette is within normal limits for size. The visualized bony structures of the thorax are intact. Telemetry leads overlie the chest. IMPRESSION: Multifocal airspace disease seen on yesterday's CT chest not readily evident on x-ray. Electronically Signed   By: Kennith Center M.D.   On: 02/06/2018 11:50   Ct Angio Chest Pe W And/or Wo Contrast  Result Date: 02/05/2018 CLINICAL DATA:  PE suspected, intermediate prob, positive D-dimer. Question nodule on radiograph. EXAM: CT ANGIOGRAPHY CHEST WITH CONTRAST TECHNIQUE: Multidetector CT imaging of the chest was performed using the standard protocol during bolus administration of intravenous contrast. Multiplanar CT image reconstructions and MIPs were obtained to evaluate the vascular anatomy. CONTRAST:  ISOVUE-370 IOPAMIDOL (ISOVUE-370) INJECTION 76% COMPARISON:  Chest and abdominal radiographs earlier this day. FINDINGS: Cardiovascular: There are no filling defects within the pulmonary arteries to the segmental level to suggest pulmonary embolus. Distal most  subsegmental branches are not well assessed. Thoracic aorta is normal in caliber without dissection. Heart is normal in size. No pericardial effusion. Mediastinum/Nodes: No enlarged mediastinal or hilar lymph nodes. Prominent left axillary nodes measure up to 8 mm short axis. Large hypodense 3.2 cm nodule in the left lobe of the thyroid gland. The esophagus is decompressed. Lungs/Pleura: Multiple peripheral ill-defined and nodular opacities within both lungs primarily in the lower lobes, largest in the left lower lobe abuts the pleura and accounts for radiographic abnormality. There is minimal surrounding ground-glass about these nodular densities. No pulmonary edema. Trachea and mainstem bronchi are patent. Bilateral pleural thickening without frank pleural effusion. Mild dependent atelectasis in the lower lobes. Upper Abdomen: No acute abnormality. Musculoskeletal: Skin thickening with ill-defined 2 cm subcutaneous density in the left anterior chest wall just off midline. No soft tissue air. There are no acute or suspicious osseous abnormalities. Review of the MIP images confirms the above findings. IMPRESSION: 1. No pulmonary embolus. 2. Multifocal pulmonary opacities in both lungs primarily peripherally with some ground-glass density. Peripheral distribution raises concern for septic emboli or pulmonary infarct, however no pulmonary embolus is visualized. Infectious etiology with multifocal pneumonia also considered. 3. Skin thickening and ill-defined 2 cm subcutaneous density in the left anterior chest wall suspicious for abscess. Prominent left axillary nodes are likely reactive. 4. Left thyroid nodule measuring 3.2 cm. Recommend elective nonemergent thyroid ultrasound for characterization. These results were called by telephone at the time of interpretation on 02/05/2018 at 2:40 am to Dr. Ross Marcus , who verbally acknowledged these results. Electronically Signed   By: Narda Rutherford M.D.   On:  02/05/2018 02:41   Dg Abdomen Acute W/chest  Result Date: 02/05/2018 CLINICAL DATA:  Back and chest pain for a few days EXAM: DG ABDOMEN ACUTE W/ 1V CHEST COMPARISON:  None FINDINGS: Normal heart size, mediastinal contours, and pulmonary vascularity. Probable BILATERAL nipple shadows. Question pulmonary mass/nodule at lower LEFT chest 1.9 x 1.4 cm. Remaining lungs clear. No pleural effusion or pneumothorax. Nonobstructive bowel gas pattern. Scattered stool throughout colon. No bowel dilatation, bowel wall thickening, or free intraperitoneal air. No  urinary tract calcification or acute osseous findings. IMPRESSION: Nonobstructive bowel gas pattern. Question nodular density lower LEFT chest 1.9 x 1.4 cm; noncontrast CT chest recommended to exclude pulmonary nodule. Normal bowel gas pattern Electronically Signed   By: Ulyses Southward M.D.   On: 02/05/2018 01:54        Scheduled Meds: . enoxaparin (LOVENOX) injection  40 mg Subcutaneous Q24H  . guaiFENesin  600 mg Oral BID  . prenatal vitamin w/FE, FA  1 tablet Oral Daily  . sodium chloride flush  3 mL Intravenous Q12H   Continuous Infusions: . sodium chloride    . sodium chloride Stopped (02/05/18 2137)  .  ceFAZolin (ANCEF) IV       LOS: 0 days        Alwyn Ren, MD [facility]  If 7PM-7AM, please contact night-coverage [email or web address] Password [facility] 02/06/2018, 12:51 PM

## 2018-02-06 NOTE — Consult Note (Signed)
Jessica Pugh Jul 12, 1985  [id number].    Requesting MD: Dr. Landis Gandy Chief Complaint/Reason for Consult: chest wall abscess  HPI:  This is a 32 yo white female with no PMH who developed pain in her back on Friday night into Saturday.  It was not severe in nature.  Over the next couple of days it persisted.  She noticed a abscess on her chest that worsened over the weekend as well.  Due to persistent back pain this area on her chest she presented to the emergency department on Monday.  She was found to have bilateral pneumonia with MSSA bacteremia and a chest wall abscess.  This was incised and drained in the emergency department.  We have been asked to evaluate it today to make sure no further intervention is needed.  The patient denies any fevers, further chest pain, shortness of breath, etc.  ROS: ROS: Please see HPI, otherwise all other systems have been reviewed and are currently negative.  Family History  Problem Relation Age of Onset  . Kidney Stones Mother   . Kidney Stones Father   . Prostate cancer Father     Past Medical History:  Diagnosis Date  . Cystic acne   . History of chicken pox   . Pregnancy induced hypertension    first pregnancy    Past Surgical History:  Procedure Laterality Date  . NO PAST SURGERIES      Social History:  reports that she has never smoked. She has never used smokeless tobacco. She reports that she does not drink alcohol or use drugs.  Allergies: No Known Allergies  Medications Prior to Admission  Medication Sig Dispense Refill  . acetaminophen (TYLENOL) 500 MG tablet Take 1,000 mg by mouth every 6 (six) hours as needed.     Marland Kitchen DM-Doxylamine-Acetaminophen (NYQUIL HBP COLD & FLU) 15-6.25-325 MG/15ML LIQD Take 30 mLs by mouth at bedtime as needed (sleep/cough).    . hydrocortisone cream 1 % Apply 1 application topically 2 (two) times daily as needed for itching.    . prenatal vitamin w/FE, FA (PRENATAL 1 + 1) 27-1 MG TABS  Take 1 tablet by mouth daily.         Physical Exam: Blood pressure (!) 112/55, pulse 73, temperature 98.5 F (36.9 C), temperature source Oral, resp. rate 14, height '5\' 1"'$  (1.549 m), weight 52.6 kg, last menstrual period 02/06/2018, SpO2 98 %, unknown if currently breastfeeding. General: pleasant, WD, WN white, female who is laying in bed in NAD HEENT: head is normocephalic, atraumatic.  Sclera are noninjected.  PERRL.  Ears and nose without any masses or lesions.  Mouth is pink and moist Heart: regular, rate, and rhythm.  Normal s1,s2. No obvious murmurs, gallops, or rubs noted.  Palpable radial and pedal pulses bilaterally Lungs/chest: CTAB, no wheezes, rhonchi, or rales noted.  Respiratory effort nonlabored.  Small wound approximately 1 x 1 cm noted on her medial, left chest.  There is no significant drainage noted.  There is a little bit of fibrinous tissue noted.  A normal saline moistened piece of gauze was packed into this very small wound.  A dry dressing was then placed.  There is some mild surrounding induration but no significant cellulitis. Abd: soft, NT, ND, +BS, no masses, hernias, or organomegaly Psych: A&Ox3 with an appropriate affect. She  Results for orders placed or performed during the hospital encounter of 02/05/18 (from the past 48 hour(s))  Urinalysis, Routine w reflex microscopic  Status: Abnormal   Collection Time: 02/04/18 11:03 PM  Result Value Ref Range   Color, Urine YELLOW YELLOW   APPearance CLEAR CLEAR   Specific Gravity, Urine 1.020 1.005 - 1.030   pH 5.5 5.0 - 8.0   Glucose, UA NEGATIVE NEGATIVE mg/dL   Hgb urine dipstick MODERATE (A) NEGATIVE   Bilirubin Urine NEGATIVE NEGATIVE   Ketones, ur NEGATIVE NEGATIVE mg/dL   Protein, ur NEGATIVE NEGATIVE mg/dL   Nitrite NEGATIVE NEGATIVE   Leukocytes, UA NEGATIVE NEGATIVE    Comment: Performed at [facility], [address] [place], [place] [address]  Pregnancy, urine     Status: None    Collection Time: 02/04/18 11:03 PM  Result Value Ref Range   Preg Test, Ur NEGATIVE NEGATIVE    Comment:        THE SENSITIVITY OF THIS METHODOLOGY IS >20 mIU/mL. Performed at [facility], [address] [place], [place] [address]   Urinalysis, Microscopic (reflex)     Status: Abnormal   Collection Time: 02/04/18 11:03 PM  Result Value Ref Range   RBC / HPF 6-10 0 - 5 RBC/hpf   WBC, UA 0-5 0 - 5 WBC/hpf   Bacteria, UA MANY (A) NONE SEEN   Squamous Epithelial / LPF 0-5 0 - 5   Mucus PRESENT     Comment: Performed at [facility], [address] [place], [place] [address]  Rapid urine drug screen (hospital performed)     Status: None   Collection Time: 02/04/18 11:03 PM  Result Value Ref Range   Opiates NONE DETECTED NONE DETECTED   Cocaine NONE DETECTED NONE DETECTED   Benzodiazepines NONE DETECTED NONE DETECTED   Amphetamines NONE DETECTED NONE DETECTED   Tetrahydrocannabinol NONE DETECTED NONE DETECTED   Barbiturates NONE DETECTED NONE DETECTED    Comment: (NOTE) DRUG SCREEN FOR MEDICAL PURPOSES ONLY.  IF CONFIRMATION IS NEEDED FOR ANY PURPOSE, NOTIFY LAB WITHIN 5 DAYS. LOWEST DETECTABLE LIMITS FOR URINE DRUG SCREEN Drug Class                     Cutoff (ng/mL) Amphetamine and metabolites    1000 Barbiturate and metabolites    200 Benzodiazepine                 127 Tricyclics and metabolites     300 Opiates and metabolites        300 Cocaine and metabolites        300 THC                            50 Performed at [facility], [address] [place], [place] [address]   Culture, Urine     Status: None   Collection Time: 02/04/18 11:03 PM  Result Value Ref Range   Specimen Description URINE, RANDOM    Special Requests      NONE Performed at [facility], [address] [place], [place] [address]    Culture NO GROWTH    Report Status 02/06/2018 FINAL   CBC with Differential     Status: Abnormal   Collection Time:  02/05/18  1:09 AM  Result Value Ref Range   WBC 11.1 (H) 4.0 - 10.5 K/uL   RBC 3.90 3.87 - 5.11 MIL/uL   Hemoglobin 12.4 12.0 - 15.0 g/dL   HCT 36.3 36.0 - 46.0 %  MCV 93.1 78.0 - 100.0 fL   MCH 31.8 26.0 - 34.0 pg   MCHC 34.2 30.0 - 36.0 g/dL   RDW 12.7 11.5 - 15.5 %   Platelets 176 150 - 400 K/uL    Comment: REPEATED TO VERIFY SPECIMEN CHECKED FOR CLOTS    Neutrophils Relative % 76 %   Neutro Abs 8.4 1.7 - 7.7 K/uL   Lymphocytes Relative 14 %   Lymphs Abs 1.6 0.7 - 4.0 K/uL   Monocytes Relative 9 %   Monocytes Absolute 1.0 0.1 - 1.0 K/uL   Eosinophils Relative 1 %   Eosinophils Absolute 0.2 0.0 - 0.7 K/uL   Basophils Relative 0 %   Basophils Absolute 0.0 0.0 - 0.1 K/uL   WBC Morphology WHITE COUNT CONFIRMED ON SMEAR    RBC Morphology ELLIPTOCYTES     Comment: TEARDROP CELLS Acanthocytes present    Smear Review PLATELET COUNT CONFIRMED BY SMEAR     Comment: Performed at [facility], [address] [place], [place] [address]  Basic metabolic panel     Status: Abnormal   Collection Time: 02/05/18  1:09 AM  Result Value Ref Range   Sodium 139 135 - 145 mmol/L   Potassium 3.7 3.5 - 5.1 mmol/L   Chloride 106 98 - 111 mmol/L   CO2 24 22 - 32 mmol/L   Glucose, Bld 101 (H) 70 - 99 mg/dL   BUN <5 (L) 6 - 20 mg/dL   Creatinine, Ser 0.64 0.44 - 1.00 mg/dL   Calcium 9.1 8.9 - 10.3 mg/dL   GFR calc non Af Amer >60 >60 mL/min   GFR calc Af Amer >60 >60 mL/min    Comment: (NOTE) The eGFR has been calculated using the CKD EPI equation. This calculation has not been validated in all clinical situations. eGFR's persistently <60 mL/min signify possible Chronic Kidney Disease.    Anion gap 9 5 - 15    Comment: Performed at [facility], [address] [place], [place] [address]  D-dimer, quantitative (not at [facility])     Status: Abnormal   Collection Time: 02/05/18  1:09 AM  Result Value Ref Range   D-Dimer, Quant 0.60 (H) 0.00 - 0.50 ug/mL-FEU    Comment:  (NOTE) At the manufacturer cut-off of 0.50 ug/mL FEU, this assay has been documented to exclude PE with a sensitivity and negative predictive value of 97 to 99%.  At this time, this assay has not been approved by the FDA to exclude DVT/VTE. Results should be correlated with clinical presentation. Performed at [facility], [address] [place], [place] [address]   Blood culture (routine x 2)     Status: Abnormal (Preliminary result)   Collection Time: 02/05/18  3:01 AM  Result Value Ref Range   Specimen Description BLOOD RIGHT ARM    Special Requests      BOTTLES DRAWN AEROBIC AND ANAEROBIC Blood Culture adequate volume Performed at [facility], [address] [place], [place] [address]    Culture  Setup Time      IN BOTH AEROBIC AND ANAEROBIC BOTTLES GRAM POSITIVE COCCI CRITICAL RESULT CALLED TO, READ BACK BY AND VERIFIED WITHLavell Luster PHARMD 2359 02/05/18 A BROWNING    Culture (A)     STAPHYLOCOCCUS AUREUS SUSCEPTIBILITIES TO FOLLOW    Report Status PENDING   Blood Culture ID Panel (Reflexed)     Status: Abnormal   Collection Time: 02/05/18  3:01 AM  Result Value Ref Range   Enterococcus species NOT DETECTED NOT DETECTED   Listeria monocytogenes NOT DETECTED NOT DETECTED   Staphylococcus species DETECTED (A) NOT DETECTED    Comment: CRITICAL RESULT CALLED TO, READ BACK BY AND VERIFIED WITH: Lavell Luster PHARMD 2359 02/05/18 A BROWNING    Staphylococcus aureus DETECTED (A) NOT DETECTED    Comment: Methicillin (oxacillin) susceptible Staphylococcus aureus (MSSA). Preferred therapy is anti staphylococcal beta lactam antibiotic (Cefazolin or Nafcillin), unless clinically contraindicated. CRITICAL RESULT CALLED TO, READ BACK BY AND VERIFIED WITH: Lavell Luster PHARMD [id number] 02/05/18 A BROWNING    Methicillin resistance NOT DETECTED NOT DETECTED   Streptococcus species NOT DETECTED NOT DETECTED   Streptococcus agalactiae NOT DETECTED NOT DETECTED    Streptococcus pneumoniae NOT DETECTED NOT DETECTED   Streptococcus pyogenes NOT DETECTED NOT DETECTED   Acinetobacter baumannii NOT DETECTED NOT DETECTED   Enterobacteriaceae species NOT DETECTED NOT DETECTED   Enterobacter cloacae complex NOT DETECTED NOT DETECTED   Escherichia coli NOT DETECTED NOT DETECTED   Klebsiella oxytoca NOT DETECTED NOT DETECTED   Klebsiella pneumoniae NOT DETECTED NOT DETECTED   Proteus species NOT DETECTED NOT DETECTED   Serratia marcescens NOT DETECTED NOT DETECTED   Haemophilus influenzae NOT DETECTED NOT DETECTED   Neisseria meningitidis NOT DETECTED NOT DETECTED   Pseudomonas aeruginosa NOT DETECTED NOT DETECTED   Candida albicans NOT DETECTED NOT DETECTED   Candida glabrata NOT DETECTED NOT DETECTED   Candida krusei NOT DETECTED NOT DETECTED   Candida parapsilosis NOT DETECTED NOT DETECTED   Candida tropicalis NOT DETECTED NOT DETECTED    Comment: Performed at [facility] [address] [place], [place] [address]  Blood culture (routine x 2)     Status: Abnormal (Preliminary result)   Collection Time: 02/05/18  3:17 AM  Result Value Ref Range   Specimen Description      BLOOD RIGHT HAND Performed at [facility], [address] [place], [place] [address]    Special Requests      BOTTLES DRAWN AEROBIC AND ANAEROBIC Blood Culture adequate volume Performed at [facility], [address] [place], [place] [address]    Culture  Setup Time      GRAM POSITIVE COCCI ANAEROBIC BOTTLE ONLY CRITICAL RESULT CALLED TO, READ BACK BY AND VERIFIED WITHLavell Luster [facility] 0034 02/05/18 A BROWNING Performed at [facility], [place] [address] [place], [place] [address]    Culture STAPHYLOCOCCUS AUREUS (A)    Report Status PENDING   I-Stat CG4 Lactic Acid, ED     Status: None   Collection Time: 02/05/18  3:26 AM  Result Value Ref Range   Lactic Acid, Venous 0.93 0.5 - 1.9 mmol/L  TSH     Status: None   Collection Time:  02/05/18  3:45 AM  Result Value Ref Range   TSH 2.128 0.350 - 4.500 uIU/mL    Comment: Performed by a 3rd Generation assay with a functional sensitivity of <=0.01 uIU/mL. Performed at [facility], [place] [address] [place], [place] [address]   HIV antibody (Routine Testing)     Status: None   Collection Time: 02/05/18  5:28 AM  Result Value Ref Range   HIV Screen 4th Generation wRfx Non Reactive Non Reactive    Comment: (NOTE) Performed At: [facility] [place], [place] [id number] Rush Farmer MD [id number]   CBC     Status: Abnormal  Collection Time: 02/05/18  5:28 AM  Result Value Ref Range   WBC 11.9 (H) 4.0 - 10.5 K/uL   RBC 3.78 (L) 3.87 - 5.11 MIL/uL   Hemoglobin 11.9 (L) 12.0 - 15.0 g/dL   HCT 35.2 (L) 36.0 - 46.0 %   MCV 93.1 78.0 - 100.0 fL   MCH 31.5 26.0 - 34.0 pg   MCHC 33.8 30.0 - 36.0 g/dL   RDW 12.9 11.5 - 15.5 %   Platelets 203 150 - 400 K/uL    Comment: Performed at [facility], [place] [address] [place], [place] [address]  Basic metabolic panel     Status: Abnormal   Collection Time: 02/05/18  5:28 AM  Result Value Ref Range   Sodium 139 135 - 145 mmol/L   Potassium 3.7 3.5 - 5.1 mmol/L   Chloride 106 98 - 111 mmol/L   CO2 23 22 - 32 mmol/L   Glucose, Bld 138 (H) 70 - 99 mg/dL   BUN <5 (L) 6 - 20 mg/dL   Creatinine, Ser 0.66 0.44 - 1.00 mg/dL   Calcium 8.9 8.9 - 10.3 mg/dL   GFR calc non Af Amer >60 >60 mL/min   GFR calc Af Amer >60 >60 mL/min    Comment: (NOTE) The eGFR has been calculated using the CKD EPI equation. This calculation has not been validated in all clinical situations. eGFR's persistently <60 mL/min signify possible Chronic Kidney Disease.    Anion gap 10 5 - 15    Comment: Performed at [facility], [place] [address] [place], [place] [address]  Strep pneumoniae urinary antigen     Status: None   Collection Time: 02/05/18  6:22 AM  Result Value Ref Range   Strep  Pneumo Urinary Antigen NEGATIVE NEGATIVE    Comment:        Infection due to S. pneumoniae cannot be absolutely ruled out since the antigen present may be below the detection limit of the test. Performed at [facility], [address], [place], [place] [address]   Urine rapid drug screen (hosp performed)     Status: None   Collection Time: 02/05/18  6:22 AM  Result Value Ref Range   Opiates NONE DETECTED NONE DETECTED   Cocaine NONE DETECTED NONE DETECTED   Benzodiazepines NONE DETECTED NONE DETECTED   Amphetamines NONE DETECTED NONE DETECTED   Tetrahydrocannabinol NONE DETECTED NONE DETECTED   Barbiturates NONE DETECTED NONE DETECTED    Comment: (NOTE) DRUG SCREEN FOR MEDICAL PURPOSES ONLY.  IF CONFIRMATION IS NEEDED FOR ANY PURPOSE, NOTIFY LAB WITHIN 5 DAYS. LOWEST DETECTABLE LIMITS FOR URINE DRUG SCREEN Drug Class                     Cutoff (ng/mL) Amphetamine and metabolites    1000 Barbiturate and metabolites    200 Benzodiazepine                 333 Tricyclics and metabolites     300 Opiates and metabolites        300 Cocaine and metabolites        300 THC                            50 Performed at [facility], [place] [address], [place],  [address]   CBC     Status: Abnormal   Collection Time: 02/06/18  5:52 AM  Result Value Ref Range   WBC 6.3 4.0 - 10.5 K/uL   RBC 3.40 (L) 3.87 - 5.11 MIL/uL   Hemoglobin 10.8 (L) 12.0 - 15.0 g/dL   HCT 31.9 (L) 36.0 - 46.0 %   MCV 93.8 78.0 - 100.0 fL   MCH 31.8 26.0 - 34.0 pg   MCHC 33.9 30.0 - 36.0 g/dL   RDW 13.1 11.5 - 15.5 %   Platelets 178 150 - 400 K/uL    Comment: Performed at [facility], [place] [address] [place] [address]  Basic metabolic panel     Status: Abnormal   Collection Time: 02/06/18  5:52 AM  Result Value Ref Range   Sodium 140 135 - 145 mmol/L   Potassium 4.1 3.5 - 5.1 mmol/L   Chloride 108 98 - 111 mmol/L   CO2 24 22 - 32 mmol/L   Glucose,  Bld 90 70 - 99 mg/dL   BUN 6 6 - 20 mg/dL   Creatinine, Ser 0.66 0.44 - 1.00 mg/dL   Calcium 8.7 (L) 8.9 - 10.3 mg/dL   GFR calc non Af Amer >60 >60 mL/min   GFR calc Af Amer >60 >60 mL/min    Comment: (NOTE) The eGFR has been calculated using the CKD EPI equation. This calculation has not been validated in all clinical situations. eGFR's persistently <60 mL/min signify possible Chronic Kidney Disease.    Anion gap 8 5 - 15    Comment: Performed at [facility], [place] [address] [place], [place] [address]   Ct Angio Chest Pe W And/or Wo Contrast  Result Date: 02/05/2018 CLINICAL DATA:  PE suspected, intermediate prob, positive D-dimer. Question nodule on radiograph. EXAM: CT ANGIOGRAPHY CHEST WITH CONTRAST TECHNIQUE: Multidetector CT imaging of the chest was performed using the standard protocol during bolus administration of intravenous contrast. Multiplanar CT image reconstructions and MIPs were obtained to evaluate the vascular anatomy. CONTRAST:  172m ISOVUE-370 IOPAMIDOL (ISOVUE-370) INJECTION 76% COMPARISON:  Chest and abdominal radiographs earlier this day. FINDINGS: Cardiovascular: There are no filling defects within the pulmonary arteries to the segmental level to suggest pulmonary embolus. Distal most subsegmental branches are not well assessed. Thoracic aorta is normal in caliber without dissection. Heart is normal in size. No pericardial effusion. Mediastinum/Nodes: No enlarged mediastinal or hilar lymph nodes. Prominent left axillary nodes measure up to 8 mm short axis. Large hypodense 3.2 cm nodule in the left lobe of the thyroid gland. The esophagus is decompressed. Lungs/Pleura: Multiple peripheral ill-defined and nodular opacities within both lungs primarily in the lower lobes, largest in the left lower lobe abuts the pleura and accounts for radiographic abnormality. There is minimal surrounding ground-glass about these nodular densities. No pulmonary edema.  Trachea and mainstem bronchi are patent. Bilateral pleural thickening without frank pleural effusion. Mild dependent atelectasis in the lower lobes. Upper Abdomen: No acute abnormality. Musculoskeletal: Skin thickening with ill-defined 2 cm subcutaneous density in the left anterior chest wall just off midline. No soft tissue air. There are no acute or suspicious osseous abnormalities. Review of the MIP images confirms the above findings. IMPRESSION: 1. No pulmonary embolus. 2. Multifocal pulmonary opacities in both lungs primarily peripherally with some ground-glass density. Peripheral distribution raises concern for septic emboli or pulmonary infarct, however no pulmonary embolus is visualized. Infectious etiology with multifocal pneumonia also considered. 3. Skin thickening and ill-defined 2 cm subcutaneous density in the left anterior chest wall suspicious for abscess. Prominent left axillary nodes are likely reactive. 4.  Left thyroid nodule measuring 3.2 cm. Recommend elective nonemergent thyroid ultrasound for characterization. These results were called by telephone at the time of interpretation on 02/05/2018 at 2:40 am to Dr. Thayer Jew , who verbally acknowledged these results. Electronically Signed   By: Keith Rake M.D.   On: 02/05/2018 02:41   Dg Abdomen Acute W/chest  Result Date: 02/05/2018 CLINICAL DATA:  Back and chest pain for a few days EXAM: DG ABDOMEN ACUTE W/ 1V CHEST COMPARISON:  None FINDINGS: Normal heart size, mediastinal contours, and pulmonary vascularity. Probable BILATERAL nipple shadows. Question pulmonary mass/nodule at lower LEFT chest 1.9 x 1.4 cm. Remaining lungs clear. No pleural effusion or pneumothorax. Nonobstructive bowel gas pattern. Scattered stool throughout colon. No bowel dilatation, bowel wall thickening, or free intraperitoneal air. No urinary tract calcification or acute osseous findings. IMPRESSION: Nonobstructive bowel gas pattern. Question nodular density  lower LEFT chest 1.9 x 1.4 cm; noncontrast CT chest recommended to exclude pulmonary nodule. Normal bowel gas pattern Electronically Signed   By: Lavonia Dana M.D.   On: 02/05/2018 01:54      Assessment/Plan Bilateral pneumonia with MSSA bacteremia and subsequent chest wall abscess Her chest wall abscess was incised and drained in the emergency department early Tuesday morning.  The patient showed me a picture of this yesterday.  Her cellulitis has essentially resolved.  There is no further purulent drainage noted.  Her wound was explored with a Q-tip and a opening was found.  A packing was placed.  Normal saline wet-to-dry dressing changes are recommended twice a day in order to debride the fibrinous tissue that is present.  No further debridement is needed at this time as I do not feel this will increase the speed of her recovery but only make her wound bigger.  This was thoroughly discussed with the patient as well as her mother.  Would defer further management of her current medical care to the.  No further surgical intervention warranted.  If the patient is patient worsens, please feel free to call us back.  Otherwise, we will sign off at this time.   Henreitta Cea, [facility] 02/06/2018, 11:36 AM Pager: [phone]

## 2018-02-06 NOTE — Consult Note (Signed)
[facility]    Date of Admission:  02/05/2018   Total days of antibiotics 3        Day 1 cefazolin               Reason for Consult: Automatic consultation for MSSA bacteremia     Assessment: She has a chest wall abscess complicated by MSSA bacteremia and nodular pneumonia.  I believe her back pain is pleurisy due to the pneumonia rather than vertebral infection.  Given the finding of mild to moderate aortic regurgitation on TTE I am certainly concerned about the possibility of endocarditis.  I recommend proceeding with TEE.  I have ordered repeat blood cultures.  I would hold off on PICC placement until we know the blood cultures are negative for at least 48 hours.  Plan: 1. Continue cefazolin 2. Await results of repeat blood cultures 3. Recommend TEE  Principal Problem:   Bacteremia due to methicillin susceptible Staphylococcus aureus (MSSA) Active Problems:   Multifocal pneumonia   Abscess of chest wall   Aortic regurgitation   Left thyroid nodule   Normocytic anemia   H/O cystic acne   Anxiety   Scheduled Meds: . enoxaparin (LOVENOX) injection  40 mg Subcutaneous Q24H  . guaiFENesin  600 mg Oral BID  . prenatal vitamin w/FE, FA  1 tablet Oral Daily  . sodium chloride flush  3 mL Intravenous Q12H   Continuous Infusions: . sodium chloride    .  ceFAZolin (ANCEF) IV 2 g (02/06/18 1439)   PRN Meds:.sodium chloride, acetaminophen **OR** acetaminophen, albuterol, ondansetron **OR** ondansetron (ZOFRAN) IV  HPI: Jessica Pugh is a 32 y.o. female who was previously in good health until she developed an abscess on her left anterior chest last week.  She also recalls having a brief episode of chills and subjective fever on Labor Day.  She had a scratchy throat that day and assumed it was a simple upper respiratory infection.  The abscess enlarged progressively leading her to come to the emergency department day before yesterday.  She was  afebrile.  The abscess was incised and drained in the emergency department.  CT scan revealed several small pulmonary nodules.  The largest nodule is in the left posterior lung and is pleural-based.  A transthoracic echocardiogram did not show any vegetations but did reveal mild to moderate aortic valve regurgitation.  Both admission blood cultures have grown MSSA.   She is feeling much better.   Review of Systems: Review of Systems  Constitutional: Positive for malaise/fatigue. Negative for chills, diaphoresis and weight loss.  HENT: Negative for congestion and sore throat.   Respiratory: Negative for cough, sputum production and shortness of breath.   Cardiovascular: Positive for chest pain.       She has had some pleuritic upper back pain left greater than right that is improving.  Gastrointestinal: Negative for abdominal pain, diarrhea, heartburn, nausea and vomiting.  Genitourinary: Negative for dysuria and frequency.  Musculoskeletal: Positive for back pain. Negative for joint pain and myalgias.  Skin: Negative for rash.  Neurological: Negative for dizziness and headaches.  Psychiatric/Behavioral: Negative for depression and substance abuse. The patient is nervous/anxious.     Past Medical History:  Diagnosis Date  . Cystic acne   . History of chicken pox   . Pregnancy induced hypertension    first pregnancy    Social History   Tobacco Use  . Smoking status: Never Smoker  .  Smokeless tobacco: Never Used  Substance Use Topics  . Alcohol use: No  . Drug use: No    Family History  Problem Relation Age of Onset  . Kidney Stones Mother   . Kidney Stones Father   . Prostate cancer Father    No Known Allergies  OBJECTIVE: Blood pressure 114/72, pulse 77, temperature 98.4 F (36.9 C), temperature source Oral, resp. rate 14, height 5\' 1"  (1.549 m), weight 52.6 kg, last menstrual period 02/06/2018, SpO2 100 %, unknown if currently breastfeeding.  Physical Exam    Constitutional: She is oriented to person, place, and time.  She is a healthy-appearing young woman sitting up in bed talking with her parents.  HENT:  Mouth/Throat: No oropharyngeal exudate.  Eyes: Conjunctivae are normal.  Neck: Neck supple.  Cardiovascular: Normal rate, regular rhythm and normal heart sounds.  No murmur heard. I cannot hear a murmur of aortic insufficiency even with Valsalva maneuver.  Pulmonary/Chest: Effort normal and breath sounds normal. She has no wheezes. She has no rales.    Abdominal: Soft. She exhibits no distension. There is no tenderness.  Musculoskeletal: Normal range of motion. She exhibits no edema or tenderness.  Neurological: She is alert and oriented to person, place, and time.  Skin: No rash noted.  She has fingernail polish on.    Lab Results Lab Results  Component Value Date   WBC 6.3 02/06/2018   HGB 10.8 (L) 02/06/2018   HCT 31.9 (L) 02/06/2018   MCV 93.8 02/06/2018   PLT 178 02/06/2018    Lab Results  Component Value Date   CREATININE 0.66 02/06/2018   BUN 6 02/06/2018   NA 140 02/06/2018   K 4.1 02/06/2018   CL 108 02/06/2018   CO2 24 02/06/2018    Lab Results  Component Value Date   ALT 13 08/30/2007   AST 43 (H) 08/30/2007   ALKPHOS 165 (H) 08/30/2007   BILITOT 0.3 08/30/2007     Microbiology: Recent Results (from the past 240 hour(s))  Culture, Urine     Status: None   Collection Time: 02/04/18 11:03 PM  Result Value Ref Range Status   Specimen Description URINE, RANDOM  Final   Special Requests   Final    NONE Performed at [facility], [address] [place], [place] [address]    Culture NO GROWTH  Final   Report Status 02/06/2018 FINAL  Final  Blood culture (routine x 2)     Status: Abnormal (Preliminary result)   Collection Time: 02/05/18  3:01 AM  Result Value Ref Range Status   Specimen Description BLOOD RIGHT ARM  Final   Special Requests   Final    BOTTLES DRAWN AEROBIC AND ANAEROBIC  Blood Culture adequate volume Performed at [facility], [address] [place], [place] [address]    Culture  Setup Time   Final    IN BOTH AEROBIC AND ANAEROBIC BOTTLES GRAM POSITIVE COCCI CRITICAL RESULT CALLED TO, READ BACK BY AND VERIFIED WITH: Peggyann Juba PHARMD 2359 02/05/18 A BROWNING    Culture (A)  Final    STAPHYLOCOCCUS AUREUS SUSCEPTIBILITIES TO FOLLOW    Report Status PENDING  Incomplete  Blood Culture ID Panel (Reflexed)     Status: Abnormal   Collection Time: 02/05/18  3:01 AM  Result Value Ref Range Status   Enterococcus species NOT DETECTED NOT DETECTED Final   Listeria monocytogenes NOT DETECTED NOT DETECTED Final   Staphylococcus species DETECTED (A) NOT  DETECTED Final    Comment: CRITICAL RESULT CALLED TO, READ BACK BY AND VERIFIED WITH: Peggyann Juba PHARMD 2359 02/05/18 A BROWNING    Staphylococcus aureus DETECTED (A) NOT DETECTED Final    Comment: Methicillin (oxacillin) susceptible Staphylococcus aureus (MSSA). Preferred therapy is anti staphylococcal beta lactam antibiotic (Cefazolin or Nafcillin), unless clinically contraindicated. CRITICAL RESULT CALLED TO, READ BACK BY AND VERIFIED WITH: Peggyann Juba PHARMD [id number] 02/05/18 A BROWNING    Methicillin resistance NOT DETECTED NOT DETECTED Final   Streptococcus species NOT DETECTED NOT DETECTED Final   Streptococcus agalactiae NOT DETECTED NOT DETECTED Final   Streptococcus pneumoniae NOT DETECTED NOT DETECTED Final   Streptococcus pyogenes NOT DETECTED NOT DETECTED Final   Acinetobacter baumannii NOT DETECTED NOT DETECTED Final   Enterobacteriaceae species NOT DETECTED NOT DETECTED Final   Enterobacter cloacae complex NOT DETECTED NOT DETECTED Final   Escherichia coli NOT DETECTED NOT DETECTED Final   Klebsiella oxytoca NOT DETECTED NOT DETECTED Final   Klebsiella pneumoniae NOT DETECTED NOT DETECTED Final   Proteus species NOT DETECTED NOT DETECTED Final   Serratia marcescens NOT DETECTED NOT DETECTED  Final   Haemophilus influenzae NOT DETECTED NOT DETECTED Final   Neisseria meningitidis NOT DETECTED NOT DETECTED Final   Pseudomonas aeruginosa NOT DETECTED NOT DETECTED Final   Candida albicans NOT DETECTED NOT DETECTED Final   Candida glabrata NOT DETECTED NOT DETECTED Final   Candida krusei NOT DETECTED NOT DETECTED Final   Candida parapsilosis NOT DETECTED NOT DETECTED Final   Candida tropicalis NOT DETECTED NOT DETECTED Final    Comment: Performed at [facility], [address] [place], [place] [address]  Blood culture (routine x 2)     Status: Abnormal (Preliminary result)   Collection Time: 02/05/18  3:17 AM  Result Value Ref Range Status   Specimen Description   Final    BLOOD RIGHT HAND Performed at [facility], [address] [place], [place] [address]    Special Requests   Final    BOTTLES DRAWN AEROBIC AND ANAEROBIC Blood Culture adequate volume Performed at [facility], [address] [place], [place] [address]    Culture  Setup Time   Final    GRAM POSITIVE COCCI ANAEROBIC BOTTLE ONLY CRITICAL RESULT CALLED TO, READ BACK BY AND VERIFIED WITHPeggyann Juba [facility] [id number] 02/05/18 A BROWNING Performed at [facility], [address] [place], [place] [address]    Culture STAPHYLOCOCCUS AUREUS (A)  Final   Report Status PENDING  Incomplete    Cliffton Asters, MD [facility] [phone] pager   [phone] cell 02/06/2018, 3:23 PM

## 2018-02-07 DIAGNOSIS — R7881 Bacteremia: Secondary | ICD-10-CM

## 2018-02-07 NOTE — Progress Notes (Signed)
PROGRESS NOTE    Jessica Pugh  XBM:[id number] DOB: 09/17/1985 DOA: 02/05/2018 PCP: Patient, No Pcp Per   Brief Narrative:31 y.o.femalewith medical history significant of cystic acne; who presents withcomplaints of back painover the last 3 days. She describes the pain as sharp in nature and initially located on the left side. However, symptoms moved to the right mid back and and shoulder area. Taking a deep inspiratory breath seemed to worsen pain. Associated symptoms included mild sinus congestion that she related to allergies, scratchy throat, and boil of the left anterior chest with swelling and pus drainage which started approximately 4 days ago. Normally patient reports the symptoms would resolve on their own. Denies any fevers, productive cough, nausea, vomiting, diarrhea, prolonged immobilization, leg swelling, or calf pain. Patient [place] debris from falling trees.  ED Course:Upon admission to the emergency department patient was noted to be haveheart rates elevated up to109, and all other vital signs maintained.Labs revealed WBC 11.1 and d-dimer 0.6. CT angiogram of the chest showed multi focal opacities concerning for septic embolior multifocal pneumoniawith left thyroid noduleshowing 3.2 cmBlood cultures were obtained and patient was started on empiric antibiotics of vancomycin and cefepime. TRH called to admit for further work-up. Admit to a telemetry bed as observation.Drug screen was noted to be negative,and i-STAT lactic acid 0.93, and TSHwaspending.   Assessment & Plan:   Principal Problem:   Bacteremia due to methicillin susceptible Staphylococcus aureus (MSSA) Active Problems:   Multifocal pneumonia   Left thyroid nodule   Abscess of chest wall   Aortic regurgitation   Normocytic anemia   H/O cystic acne   Anxiety  1] anterior chest wall abscess from infected cystic  acne with MSSA bacteremia status post IND on Ancef continue.  Repeat blood cultures pending.  Transthoracic echo showed aortic regurgitation.  She is scheduled to have TEE tomorrow.  Still complains of pleuritic chest pain with deep breathing?  Pain may be related to infectious process pleurisy as well as a small pulmonary infarct.  On room air saturation above 94%.  2] possible multifocal pneumonia secondary to #1 continue Ancef patient stable.  3] left thyroid nodule follow-up as an outpatient.    DVT prophylaxis:LOVENOX Code Status FULL Family Communication:DW MOTHER Disposition Plan:TBD Consultants:  ID Procedures:NONE Antimicrobials:  ANCEF Subjective: Patient resting in bed in no acute distress mother by the bedside denies any cough has complaints of pleuritic chest pain no nausea vomiting or diarrhea.  Objective: Vitals:   02/06/18 1348 02/06/18 2100 02/07/18 0520 02/07/18 1231  BP: 114/72 116/60 113/69 108/62  Pulse: 77 94 84 (!) 59  Resp:  18 18   Temp: 98.4 F (36.9 C) 98.2 F (36.8 C) 98.4 F (36.9 C) 98.3 F (36.8 C)  TempSrc: Oral Oral Oral Oral  SpO2: 100% 100% 100% 100%  Weight:      Height:        Intake/Output Summary (Last 24 hours) at 02/07/2018 1317 Last data filed at 02/07/2018 0600 Gross per 24 hour  Intake 200 ml  Output 500 ml  Net -300 ml   Filed Weights   02/04/18 2302 02/05/18 0606  Weight: 52.2 kg 52.6 kg    Examination:  General exam: Appears calm and comfortable  Respiratory system: Clear to auscultation. Respiratory effort normal. Cardiovascular system: S1 & S2 heard, RRR. No JVD, murmurs, rubs, gallops or clicks. No pedal edema. Gastrointestinal system: Abdomen is nondistended, soft and nontender. No  organomegaly or masses felt. Normal bowel sounds heard. Central nervous system: Alert and oriented. No focal neurological deficits. Extremities: Symmetric 5 x 5 power. Skin: No rashes, lesions or ulcers Psychiatry: Judgement and  insight appear normal. Mood & affect appropriate.     Data Reviewed: I have personally reviewed following labs and imaging studies  CBC: Recent Labs  Lab 02/05/18 0109 02/05/18 0528 02/06/18 0552  WBC 11.1* 11.9* 6.3  NEUTROABS 8.4  --   --   HGB 12.4 11.9* 10.8*  HCT 36.3 35.2* 31.9*  MCV 93.1 93.1 93.8  PLT 176 203 178   Basic Metabolic Panel: Recent Labs  Lab 02/05/18 0109 02/05/18 0528 02/06/18 0552  NA 139 139 140  K 3.7 3.7 4.1  CL 106 106 108  CO2 24 23 24   GLUCOSE 101* 138* 90  BUN <5* <5* 6  CREATININE 0.64 0.66 0.66  CALCIUM 9.1 8.9 8.7*   GFR: Estimated Creatinine Clearance: 75.5 mL/min (by C-G formula based on SCr of 0.66 mg/dL). Liver Function Tests: No results for input(s): AST, ALT, ALKPHOS, BILITOT, PROT, ALBUMIN in the last 168 hours. No results for input(s): LIPASE, AMYLASE in the last 168 hours. No results for input(s): AMMONIA in the last 168 hours. Coagulation Profile: No results for input(s): INR, PROTIME in the last 168 hours. Cardiac Enzymes: No results for input(s): CKTOTAL, CKMB, CKMBINDEX, TROPONINI in the last 168 hours. BNP (last 3 results) No results for input(s): PROBNP in the last 8760 hours. HbA1C: No results for input(s): HGBA1C in the last 72 hours. CBG: No results for input(s): GLUCAP in the last 168 hours. Lipid Profile: No results for input(s): CHOL, HDL, LDLCALC, TRIG, CHOLHDL, LDLDIRECT in the last 72 hours. Thyroid Function Tests: Recent Labs    02/05/18 0345  TSH 2.128   Anemia Panel: No results for input(s): VITAMINB12, FOLATE, FERRITIN, TIBC, IRON, RETICCTPCT in the last 72 hours. Sepsis Labs: Recent Labs  Lab 02/05/18 0326  LATICACIDVEN 0.93    Recent Results (from the past 240 hour(s))  Culture, Urine     Status: None   Collection Time: 02/04/18 11:03 PM  Result Value Ref Range Status   Specimen Description URINE, RANDOM  Final   Special Requests   Final    NONE Performed at [facility] [place] [facility],  [address] [place], [place] [address]    Culture NO GROWTH  Final   Report Status 02/06/2018 FINAL  Final  Blood culture (routine x 2)     Status: Abnormal (Preliminary result)   Collection Time: 02/05/18  3:01 AM  Result Value Ref Range Status   Specimen Description   Final    BLOOD RIGHT ARM Performed at [facility] [place] [facility], [address] [place], [place] [address]    Special Requests   Final    BOTTLES DRAWN AEROBIC AND ANAEROBIC Blood Culture adequate volume Performed at [facility] [place], [address] [place], [place] [address]    Culture  Setup Time   Final    IN BOTH AEROBIC AND ANAEROBIC BOTTLES GRAM POSITIVE COCCI CRITICAL RESULT CALLED TO, READ BACK BY AND VERIFIED WITH: Peggyann JubaJ GRIMSLEY PHARMD 2359 02/05/18 A BROWNING    Culture STAPHYLOCOCCUS AUREUS (A)  Final   Report Status PENDING  Incomplete   Organism ID, Bacteria STAPHYLOCOCCUS AUREUS  Final      Susceptibility   Staphylococcus aureus - MIC*    CIPROFLOXACIN <=0.5 SENSITIVE Sensitive     ERYTHROMYCIN >=8 RESISTANT Resistant  GENTAMICIN <=0.5 SENSITIVE Sensitive     OXACILLIN 0.5 SENSITIVE Sensitive     TETRACYCLINE <=1 SENSITIVE Sensitive     VANCOMYCIN 1 SENSITIVE Sensitive     TRIMETH/SULFA <=10 SENSITIVE Sensitive     CLINDAMYCIN <=0.25 SENSITIVE Sensitive     RIFAMPIN <=0.5 SENSITIVE Sensitive     Inducible Clindamycin NEGATIVE Sensitive     * STAPHYLOCOCCUS AUREUS  Blood Culture ID Panel (Reflexed)     Status: Abnormal   Collection Time: 02/05/18  3:01 AM  Result Value Ref Range Status   Enterococcus species NOT DETECTED NOT DETECTED Final   Listeria monocytogenes NOT DETECTED NOT DETECTED Final   Staphylococcus species DETECTED (A) NOT DETECTED Final    Comment: CRITICAL RESULT CALLED TO, READ BACK BY AND VERIFIED WITH: Peggyann Juba PHARMD 2359 02/05/18 A BROWNING    Staphylococcus aureus DETECTED (A) NOT DETECTED Final    Comment: Methicillin (oxacillin) susceptible Staphylococcus  aureus (MSSA). Preferred therapy is anti staphylococcal beta lactam antibiotic (Cefazolin or Nafcillin), unless clinically contraindicated. CRITICAL RESULT CALLED TO, READ BACK BY AND VERIFIED WITH: Peggyann Juba PHARMD 1610 02/05/18 A BROWNING    Methicillin resistance NOT DETECTED NOT DETECTED Final   Streptococcus species NOT DETECTED NOT DETECTED Final   Streptococcus agalactiae NOT DETECTED NOT DETECTED Final   Streptococcus pneumoniae NOT DETECTED NOT DETECTED Final   Streptococcus pyogenes NOT DETECTED NOT DETECTED Final   Acinetobacter baumannii NOT DETECTED NOT DETECTED Final   Enterobacteriaceae species NOT DETECTED NOT DETECTED Final   Enterobacter cloacae complex NOT DETECTED NOT DETECTED Final   Escherichia coli NOT DETECTED NOT DETECTED Final   Klebsiella oxytoca NOT DETECTED NOT DETECTED Final   Klebsiella pneumoniae NOT DETECTED NOT DETECTED Final   Proteus species NOT DETECTED NOT DETECTED Final   Serratia marcescens NOT DETECTED NOT DETECTED Final   Haemophilus influenzae NOT DETECTED NOT DETECTED Final   Neisseria meningitidis NOT DETECTED NOT DETECTED Final   Pseudomonas aeruginosa NOT DETECTED NOT DETECTED Final   Candida albicans NOT DETECTED NOT DETECTED Final   Candida glabrata NOT DETECTED NOT DETECTED Final   Candida krusei NOT DETECTED NOT DETECTED Final   Candida parapsilosis NOT DETECTED NOT DETECTED Final   Candida tropicalis NOT DETECTED NOT DETECTED Final    Comment: Performed at [facility], [address] [place], [place] [address]  Blood culture (routine x 2)     Status: Abnormal (Preliminary result)   Collection Time: 02/05/18  3:17 AM  Result Value Ref Range Status   Specimen Description   Final    BLOOD RIGHT HAND Performed at [facility], [address] [place], [place] [address]    Special Requests   Final    BOTTLES DRAWN AEROBIC AND ANAEROBIC Blood Culture adequate volume Performed at [facility], [address] [place], [place] [address]    Culture  Setup Time   Final    GRAM POSITIVE COCCI ANAEROBIC BOTTLE ONLY CRITICAL RESULT CALLED TO, READ BACK BY AND VERIFIED WITH: Peggyann Juba PHARMD 2359 02/05/18 A BROWNING    Culture (A)  Final    STAPHYLOCOCCUS AUREUS SUSCEPTIBILITIES PERFORMED ON PREVIOUS CULTURE WITHIN THE LAST 5 DAYS. Performed at [facility], [address], [place], [place] [address]    Report Status PENDING  Incomplete  Culture, blood (routine x 2)     Status: None (Preliminary result)   Collection Time: 02/06/18  8:38 AM  Result Value Ref Range Status  Specimen Description   Final    BLOOD LEFT ARM Performed at [facility], [address] [place], [place] [address]    Special Requests   Final    BOTTLES DRAWN AEROBIC AND ANAEROBIC Blood Culture adequate volume Performed at [facility], [address], [place], [place] [address]    Culture   Final    NO GROWTH < 24 HOURS Performed at [facility], [address] [place], [place] [address]    Report Status PENDING  Incomplete  Culture, blood (routine x 2)     Status: None (Preliminary result)   Collection Time: 02/06/18  8:38 AM  Result Value Ref Range Status   Specimen Description   Final    BLOOD LEFT HAND Performed at  [facility], [address], [place], [place] [address]    Special Requests   Final    BOTTLES DRAWN AEROBIC ONLY Blood Culture adequate volume Performed at [facility], [address] [place], [place] [address]    Culture   Final    NO GROWTH < 24 HOURS Performed at [facility], [address], [place], [place] [address]    Report Status PENDING  Incomplete         Radiology Studies: Dg Chest 1 View  Result Date: 02/06/2018 CLINICAL DATA:  Pneumonia EXAM: CHEST  1 VIEW COMPARISON:  Chest CT 02/05/2018 FINDINGS: 1003 hours. AP portable exam shows no focal lung consolidation. No  pulmonary edema or pleural effusion. The cardiopericardial silhouette is within normal limits for size. The visualized bony structures of the thorax are intact. Telemetry leads overlie the chest. IMPRESSION: Multifocal airspace disease seen on yesterday's CT chest not readily evident on x-ray. Electronically Signed   By: Kennith Center M.D.   On: 02/06/2018 11:50        Scheduled Meds: . enoxaparin (LOVENOX) injection  40 mg Subcutaneous Q24H  . guaiFENesin  600 mg Oral BID  . prenatal vitamin w/FE, FA  1 tablet Oral Daily  . sodium chloride flush  3 mL Intravenous Q12H   Continuous Infusions: . sodium chloride    .  ceFAZolin (ANCEF) IV 2 g (02/07/18 0505)     LOS: 1 day     Alwyn Ren, MD [facility]  If 7PM-7AM, please contact night-coverage [email or web address] Password [email or web address] 02/07/2018, 1:17 PM

## 2018-02-07 NOTE — Progress Notes (Signed)
[facility] arranged for pt to [facility] at [facility] at 12:30 on 02/08/18.

## 2018-02-07 NOTE — Progress Notes (Signed)
Pt back in room.

## 2018-02-07 NOTE — Progress Notes (Signed)
    [facility] has been requested to perform a transesophageal echocardiogram on 02/08/18 for bacteremia.  After careful review of history and examination, the risks and benefits of transesophageal echocardiogram have been explained including risks of esophageal damage, perforation (1:10,000 risk), bleeding, pharyngeal hematoma as well as other potential complications associated with conscious sedation including aspiration, arrhythmia, respiratory failure and death. Alternatives to treatment were discussed, questions were answered. Patient is willing to proceed.   TEE scheduled for 02/08/18 at 2:15 with Dr. McLean Denell Cothern, PA-C 02/07/2018 4:28 PM   

## 2018-02-07 NOTE — Progress Notes (Signed)
Patient ID: Jessica Pugh, female   DOB: 1985/10/22, 32 y.o.   MRN: [id number]         [facility]  Date of Admission:  02/05/2018   Total days of antibiotics 4        Day 2 cefazolin         ASSESSMENT: She is improving rapidly on therapy for a large chest wall abscess complicated by MSSA bacteremia with probable bacteremic seeding of her lung.  Repeat blood cultures are negative at 24 hours.  If they remain negative tomorrow morning I will order PICC placement.  Her TTE showed mild to moderate aortic regurgitation but no vegetation.  She needs a TEE to help determine the cause of her aortic regurgitation and appropriate duration of IV antibiotic therapy.  PLAN: 1. Continue cefazolin 2. Await results of repeat blood cultures 3. TEE  Principal Problem:   Bacteremia due to methicillin susceptible Staphylococcus aureus (MSSA) Active Problems:   Multifocal pneumonia   Abscess of chest wall   Aortic regurgitation   Left thyroid nodule   Normocytic anemia   H/O cystic acne   Anxiety   Scheduled Meds: . enoxaparin (LOVENOX) injection  40 mg Subcutaneous Q24H  . guaiFENesin  600 mg Oral BID  . prenatal vitamin w/FE, FA  1 tablet Oral Daily  . sodium chloride flush  3 mL Intravenous Q12H   Continuous Infusions: . sodium chloride    .  ceFAZolin (ANCEF) IV 2 g (02/07/18 0505)   PRN Meds:.sodium chloride, acetaminophen **OR** acetaminophen, albuterol, ondansetron **OR** ondansetron (ZOFRAN) IV, zolpidem   SUBJECTIVE: She is feeling much better.  The pleuritic pain in her upper back is nearly completely gone.  Review of Systems: Review of Systems  Constitutional: Negative for chills, diaphoresis, fever, malaise/fatigue and weight loss.  HENT: Negative for congestion and sore throat.   Respiratory: Negative for cough, sputum production and shortness of breath.   Cardiovascular: Negative for chest pain.  Gastrointestinal: Negative for abdominal pain,  diarrhea, heartburn, nausea and vomiting.  Genitourinary: Negative for dysuria and frequency.  Musculoskeletal: Positive for back pain. Negative for joint pain and myalgias.  Skin: Negative for rash.  Neurological: Negative for dizziness and headaches.  Psychiatric/Behavioral: Negative for depression. The patient is nervous/anxious.     No Known Allergies  OBJECTIVE: Vitals:   02/06/18 0525 02/06/18 1348 02/06/18 2100 02/07/18 0520  BP: (!) 112/55 114/72 116/60 113/69  Pulse: 73 77 94 84  Resp: 14  18 18   Temp: 98.5 F (36.9 C) 98.4 F (36.9 C) 98.2 F (36.8 C) 98.4 F (36.9 C)  TempSrc: Oral Oral Oral Oral  SpO2: 98% 100% 100% 100%  Weight:      Height:       Body mass index is 21.9 kg/m.  Physical Exam  Constitutional: She is oriented to person, place, and time.  She is up walking in the hallway with her husband.  She looks perfectly healthy.  HENT:  Mouth/Throat: No oropharyngeal exudate.  Eyes: Conjunctivae are normal.  Cardiovascular: Normal rate, regular rhythm and normal heart sounds.  No murmur heard. Pulmonary/Chest: Effort normal and breath sounds normal.    Abdominal: Soft. She exhibits no distension and no mass. There is no tenderness.  Musculoskeletal: Normal range of motion. She exhibits no edema or tenderness.  Neurological: She is alert and oriented to person, place, and time.  Skin: No rash noted.  Psychiatric: She has a normal mood and affect.    Lab Results  Lab Results  Component Value Date   WBC 6.3 02/06/2018   HGB 10.8 (L) 02/06/2018   HCT 31.9 (L) 02/06/2018   MCV 93.8 02/06/2018   PLT 178 02/06/2018    Lab Results  Component Value Date   CREATININE 0.66 02/06/2018   BUN 6 02/06/2018   NA 140 02/06/2018   K 4.1 02/06/2018   CL 108 02/06/2018   CO2 24 02/06/2018    Lab Results  Component Value Date   ALT 13 08/30/2007   AST 43 (H) 08/30/2007   ALKPHOS 165 (H) 08/30/2007   BILITOT 0.3 08/30/2007     Microbiology: Recent  Results (from the past 240 hour(s))  Culture, Urine     Status: None   Collection Time: 02/04/18 11:03 PM  Result Value Ref Range Status   Specimen Description URINE, RANDOM  Final   Special Requests   Final    NONE Performed at [facility] [place], [address] [place], [place] [address]    Culture NO GROWTH  Final   Report Status 02/06/2018 FINAL  Final  Blood culture (routine x 2)     Status: Abnormal (Preliminary result)   Collection Time: 02/05/18  3:01 AM  Result Value Ref Range Status   Specimen Description   Final    BLOOD RIGHT ARM Performed at [facility] [place], [address] [place], [place] [address]    Special Requests   Final    BOTTLES DRAWN AEROBIC AND ANAEROBIC Blood Culture adequate volume Performed at  [facility] [place], [address] [place], [place] [address]    Culture  Setup Time   Final    IN BOTH AEROBIC AND ANAEROBIC BOTTLES GRAM POSITIVE COCCI CRITICAL RESULT CALLED TO, READ BACK BY AND VERIFIED WITH: Peggyann JubaJ GRIMSLEY PHARMD 2359 02/05/18 A BROWNING    Culture STAPHYLOCOCCUS AUREUS (A)  Final   Report Status PENDING  Incomplete   Organism ID, Bacteria STAPHYLOCOCCUS AUREUS  Final      Susceptibility   Staphylococcus aureus - MIC*    CIPROFLOXACIN <=0.5 SENSITIVE Sensitive     ERYTHROMYCIN >=8 RESISTANT Resistant     GENTAMICIN <=0.5 SENSITIVE Sensitive     OXACILLIN 0.5 SENSITIVE Sensitive     TETRACYCLINE <=1 SENSITIVE Sensitive     VANCOMYCIN 1 SENSITIVE Sensitive     TRIMETH/SULFA <=10 SENSITIVE Sensitive     CLINDAMYCIN <=0.25 SENSITIVE Sensitive     RIFAMPIN <=0.5 SENSITIVE Sensitive     Inducible Clindamycin NEGATIVE Sensitive     * STAPHYLOCOCCUS AUREUS  Blood Culture ID Panel (Reflexed)     Status: Abnormal   Collection Time: 02/05/18  3:01 AM  Result Value Ref Range Status   Enterococcus species NOT DETECTED NOT DETECTED Final   Listeria monocytogenes NOT DETECTED NOT DETECTED Final   Staphylococcus species DETECTED (A)  NOT DETECTED Final    Comment: CRITICAL RESULT CALLED TO, READ BACK BY AND VERIFIED WITH: Peggyann JubaJ GRIMSLEY PHARMD [id number] 02/05/18 A BROWNING    Staphylococcus aureus DETECTED (A) NOT DETECTED Final    Comment: Methicillin (oxacillin) susceptible Staphylococcus aureus (MSSA). Preferred therapy is anti staphylococcal beta lactam antibiotic (Cefazolin or Nafcillin), unless clinically contraindicated. CRITICAL RESULT CALLED TO, READ BACK BY AND VERIFIED WITH: Peggyann JubaJ GRIMSLEY PHARMD [phone] 02/05/18 A BROWNING    Methicillin resistance NOT DETECTED NOT DETECTED Final   Streptococcus species NOT DETECTED NOT DETECTED Final   Streptococcus agalactiae NOT DETECTED NOT DETECTED Final   Streptococcus pneumoniae NOT DETECTED NOT DETECTED  Final   Streptococcus pyogenes NOT DETECTED NOT DETECTED Final   Acinetobacter baumannii NOT DETECTED NOT DETECTED Final   Enterobacteriaceae species NOT DETECTED NOT DETECTED Final   Enterobacter cloacae complex NOT DETECTED NOT DETECTED Final   Escherichia coli NOT DETECTED NOT DETECTED Final   Klebsiella oxytoca NOT DETECTED NOT DETECTED Final   Klebsiella pneumoniae NOT DETECTED NOT DETECTED Final   Proteus species NOT DETECTED NOT DETECTED Final   Serratia marcescens NOT DETECTED NOT DETECTED Final   Haemophilus influenzae NOT DETECTED NOT DETECTED Final   Neisseria meningitidis NOT DETECTED NOT DETECTED Final   Pseudomonas aeruginosa NOT DETECTED NOT DETECTED Final   Candida albicans NOT DETECTED NOT DETECTED Final   Candida glabrata NOT DETECTED NOT DETECTED Final   Candida krusei NOT DETECTED NOT DETECTED Final   Candida parapsilosis NOT DETECTED NOT DETECTED Final   Candida tropicalis NOT DETECTED NOT DETECTED Final    Comment: Performed at [facility], [address], [place], [place] [address]  Blood culture (routine x 2)     Status: Abnormal (Preliminary result)   Collection Time: 02/05/18  3:17 AM  Result Value Ref Range Status   Specimen Description   Final     BLOOD RIGHT HAND Performed at [facility], [address] [place], [place] [address]    Special Requests   Final    BOTTLES DRAWN AEROBIC AND ANAEROBIC Blood Culture adequate volume Performed at [facility], [address] [place], [place] [address]    Culture  Setup Time   Final    GRAM POSITIVE COCCI ANAEROBIC BOTTLE ONLY CRITICAL RESULT CALLED TO, READ BACK BY AND VERIFIED WITH: Peggyann Juba PHARMD 2359 02/05/18 A BROWNING    Culture (A)  Final    STAPHYLOCOCCUS AUREUS SUSCEPTIBILITIES PERFORMED ON PREVIOUS CULTURE WITHIN THE LAST 5 DAYS. Performed at [facility], [address], [place], [place] [address]    Report Status PENDING  Incomplete  Culture, blood (routine x 2)     Status: None (Preliminary result)   Collection Time: 02/06/18  8:38 AM  Result Value Ref Range Status   Specimen Description   Final    BLOOD LEFT ARM Performed at [facility], [address] [place], [place] [address]    Special Requests   Final    BOTTLES DRAWN AEROBIC AND ANAEROBIC Blood Culture adequate volume Performed at [facility], [address], [place], [place] [address]    Culture   Final    NO GROWTH < 24 HOURS Performed at [facility], [address], [place], [place] [address]    Report Status PENDING  Incomplete  Culture, blood (routine x 2)     Status: None (Preliminary result)   Collection Time: 02/06/18  8:38 AM  Result Value Ref Range Status   Specimen Description   Final    BLOOD LEFT HAND Performed at [facility], [address] [place], [place] [address]    Special Requests   Final    BOTTLES DRAWN AEROBIC ONLY Blood Culture adequate volume Performed at [facility], [address], [place], [place] [address]    Culture   Final    NO GROWTH < 24 HOURS Performed at [facility], [address], [place], [place] [address]    Report Status PENDING   Incomplete    Cliffton Asters, MD [facility] [phone] pager   [phone] cell 02/07/2018, 10:51 AM

## 2018-02-07 NOTE — Progress Notes (Signed)
Pt off unit with her mom and husband. Per pt, pt going to sit on 2nd floor.

## 2018-02-07 NOTE — H&P (View-Only) (Signed)
    [facility] has been requested to perform a transesophageal echocardiogram on 02/08/18 for bacteremia.  After careful review of history and examination, the risks and benefits of transesophageal echocardiogram have been explained including risks of esophageal damage, perforation (1:10,000 risk), bleeding, pharyngeal hematoma as well as other potential complications associated with conscious sedation including aspiration, arrhythmia, respiratory failure and death. Alternatives to treatment were discussed, questions were answered. Patient is willing to proceed.   TEE scheduled for 02/08/18 at 2:15 with Dr. Inocencio HomesMcLean Tsutomu Barfoot, PA-C 02/07/2018 4:28 PM

## 2018-02-07 NOTE — Progress Notes (Signed)
Patient is scheduled for [facility] @ 1400 at [facility]. Patient needs to arrive via [facility] @ 1230. Spoke with Cicero DuckErika, RN to set up transport. Patient will need to be NPO after midnight.

## 2018-02-08 ENCOUNTER — Encounter (HOSPITAL_COMMUNITY): Payer: Self-pay | Admitting: *Deleted

## 2018-02-08 ENCOUNTER — Inpatient Hospital Stay: Payer: Self-pay

## 2018-02-08 ENCOUNTER — Encounter (HOSPITAL_COMMUNITY)
Admission: EM | Disposition: A | Discharge status: Home / Self Care | Payer: Self-pay | Source: Home / Self Care | Attending: Internal Medicine

## 2018-02-08 ENCOUNTER — Inpatient Hospital Stay (HOSPITAL_COMMUNITY): Payer: 59 | Admitting: Anesthesiology

## 2018-02-08 ENCOUNTER — Inpatient Hospital Stay (HOSPITAL_COMMUNITY): Payer: 59

## 2018-02-08 DIAGNOSIS — I351 Nonrheumatic aortic (valve) insufficiency: Secondary | ICD-10-CM

## 2018-02-08 HISTORY — PX: TEE WITHOUT CARDIOVERSION: SHX5443

## 2018-02-08 LAB — ANCA TITERS: C-ANCA: <1:20 {titer}

## 2018-02-08 LAB — CULTURE, BLOOD (ROUTINE X 2)
SPECIAL REQUESTS: ADEQUATE
Special Requests: ADEQUATE

## 2018-02-08 LAB — GLOMERULAR BASEMENT MEMBRANE ANTIBODIES: GBM Ab: 2 units (ref 0–20)

## 2018-02-08 SURGERY — ECHOCARDIOGRAM, TRANSESOPHAGEAL
Anesthesia: Monitor Anesthesia Care

## 2018-02-08 MED ORDER — SODIUM CHLORIDE 0.9 % IV SOLN: INTRAVENOUS | Status: DC

## 2018-02-08 MED ORDER — MIDAZOLAM HCL 5 MG/ML IJ SOLN
INTRAMUSCULAR | Status: AC
  Filled 2018-02-08: qty 1

## 2018-02-08 MED ORDER — SODIUM CHLORIDE 0.9% FLUSH: 10.0000 mL | Freq: Two times a day (BID) | INTRAVENOUS | Status: DC

## 2018-02-08 MED ORDER — PROPOFOL 10 MG/ML IV BOLUS
INTRAVENOUS | Status: DC | PRN
  Administered 2018-02-08: 20 mg via INTRAVENOUS
  Administered 2018-02-08: 10 mg via INTRAVENOUS
  Administered 2018-02-08: 30 mg via INTRAVENOUS

## 2018-02-08 MED ORDER — SODIUM CHLORIDE 0.9% FLUSH: 10.0000 mL | INTRAVENOUS | Status: DC | PRN

## 2018-02-08 MED ORDER — LIDOCAINE 2% (20 MG/ML) 5 ML SYRINGE
INTRAMUSCULAR | Status: DC | PRN
  Administered 2018-02-08: 40 mg via INTRAVENOUS

## 2018-02-08 MED ORDER — PROPOFOL 500 MG/50ML IV EMUL
INTRAVENOUS | Status: DC | PRN
  Administered 2018-02-08: 100 ug/kg/min via INTRAVENOUS

## 2018-02-08 MED ORDER — LACTATED RINGERS IV SOLN
INTRAVENOUS | Status: DC | PRN
  Administered 2018-02-08: 14:00:00 via INTRAVENOUS

## 2018-02-08 MED ORDER — CEFAZOLIN IV (FOR PTA / DISCHARGE USE ONLY): 2.0000 g | Freq: Three times a day (TID) | INTRAVENOUS | 0 refills | Status: AC

## 2018-02-08 MED ORDER — MIDAZOLAM HCL 5 MG/5ML IJ SOLN
INTRAMUSCULAR | Status: DC | PRN
  Administered 2018-02-08: 1 mg via INTRAVENOUS

## 2018-02-08 NOTE — Care Management Note (Signed)
Case Management Note  Patient Details  Name: Jessica Pugh MRN: [id number] Date of Birth: 07-20-1985  Subjective/Objective:  For Jessica Pugh, & PICC. ID-plan for iv abx long term-[facility] chosen by patient-iv liason Pam already following-[facility] HHRN-also for home instruction-rep Clydie BraunKaren aware. [place] rep aware. Await final HHC,iv abx orders.                 Action/Plan:d/c home w/HHC/iv abx.   Expected Discharge Date:                  Expected Discharge Plan:  Home/Self Care  In-House Referral:     Discharge planning Services  CM Consult  Post Acute Care Choice:    Choice offered to:  Patient  DME Arranged:    DME Agency:     HH Arranged:  RN, IV Antibiotics HH Agency:  [facility]  Status of Service:  In process, will continue to follow  If discussed at Long Length of Stay Meetings, dates discussed:    Additional Comments:  Lanier ClamMahabir, Strider Vallance, RN 02/08/2018, 11:46 AM

## 2018-02-08 NOTE — Progress Notes (Signed)
PHARMACY CONSULT NOTE FOR:  OUTPATIENT  PARENTERAL ANTIBIOTIC THERAPY (OPAT)  Indication: MSSA bacteremia Regimen: Ancef 2g IV q8 hr End date: 02/25/2018  IV antibiotic discharge orders are pended. To discharging provider:  please sign these orders via discharge navigator,  Select New Orders & click on the button choice - Manage This Unsigned Work.     Thank you for allowing pharmacy to be a part of this patient's care.  Chantilly Linskey A 02/08/2018, 4:28 PM

## 2018-02-08 NOTE — Progress Notes (Signed)
  Echocardiogram Echocardiogram Transesophageal has been performed.  Leta JunglingCooper, Elianna Windom M 02/08/2018, 2:27 PM

## 2018-02-08 NOTE — Interval H&P Note (Signed)
History and Physical Interval Note:  02/08/2018 2:14 PM  Jessica Pugh  has presented today for surgery, with the diagnosis of BACTEREMIA  The various methods of treatment have been discussed with the patient and family. After consideration of risks, benefits and other options for treatment, the patient has consented to  Procedure(s): TRANSESOPHAGEAL ECHOCARDIOGRAM (TEE) (N/A) as a surgical intervention .  The patient's history has been reviewed, patient examined, no change in status, stable for surgery.  I have reviewed the patient's chart and labs.  Questions were answered to the patient's satisfaction.     Sheldon Amara [employer]

## 2018-02-08 NOTE — Discharge Summary (Signed)
Physician Discharge Summary  Jessica Pugh ZOX:[id number] DOB: 01-Apr-1986 DOA: 02/05/2018  PCP: Patient, No Pcp Per  Admit date: 02/05/2018 Discharge date: 02/08/2018  Admitted From: Home Disposition: Home Recommendations for Outpatient Follow-up:  1. Follow up with PCP in 1-2 weeks 2. Please obtain BMP/CBC in one week  Home Health: Advanced home care Equipment/Devices: None Discharge Condition: Stable CODE STATUS: Full code Diet recommendation: Regular diet  Brief/Interim Summary:31 y.o.femalewith medical history significant of cystic acne; who presents withcomplaints of back painover the last 3 days. She describes the pain as sharp in nature and initially located on the left side. However, symptoms moved to the right mid back and and shoulder area. Taking a deep inspiratory breath seemed to worsen pain. Associated symptoms included mild sinus congestion that she related to allergies, scratchy throat, and boil of the left anterior chest with swelling and pus drainage which started approximately 4 days ago. Normally patient reports the symptoms would resolve on their own. Denies any fevers, productive cough, nausea, vomiting, diarrhea, prolonged immobilization, leg swelling, or calf pain. Patient [place] debris from falling trees.  ED Course:Upon admission to the emergency department patient was noted to be haveheart rates elevated up to109, and all other vital signs maintained.Labs revealed WBC 11.1 and d-dimer 0.6. CT angiogram of the chest showed multi focal opacities concerning for septic embolior multifocal pneumoniawith left thyroid noduleshowing 3.2 cmBlood cultures were obtained and patient was started on empiric antibiotics of vancomycin and cefepime. TRH called to admit for further work-up. Admit to a telemetry bed as observation.Drug screen was noted to be negative,and i-STAT lactic  acid 0.93, and TSHwaspending. Discharge Diagnoses:  Principal Problem:   Bacteremia due to methicillin susceptible Staphylococcus aureus (MSSA) Active Problems:   Multifocal pneumonia   Left thyroid nodule   Abscess of chest wall   Aortic regurgitation   Normocytic anemia   H/O cystic acne   Anxiety  1] bacteremia MSSA with anterior chest wall abscess secondary to infected cystic acne with pneumonia.  Transesophageal echo shows no evidence of vegetation.  Patient will be discharged on Ancef for 3 weeks last dose 02/25/2018.  2] left thyroid nodule patient needs follow-up as an outpatient follow-up with PCP.  Discharge Instructions  Discharge Instructions    Call MD for:  difficulty breathing, headache or visual disturbances   Complete by:  As directed    Call MD for:  persistant dizziness or light-headedness   Complete by:  As directed    Call MD for:  severe uncontrolled pain   Complete by:  As directed    Call MD for:  temperature >100.4   Complete by:  As directed    Diet - low sodium heart healthy   Complete by:  As directed    Diet - low sodium heart healthy   Complete by:  As directed    Increase activity slowly   Complete by:  As directed    Increase activity slowly   Complete by:  As directed      Allergies as of 02/08/2018   No Known Allergies     Medication List    STOP taking these medications   acetaminophen 500 MG tablet Commonly known as:  TYLENOL   NYQUIL HBP COLD & FLU 15-6.25-325 MG/15ML Liqd Generic drug:  DM-Doxylamine-Acetaminophen     TAKE these medications   hydrocortisone cream 1 % Apply 1 application topically 2 (two) times daily as needed for  itching.   prenatal vitamin w/FE, FA 27-1 MG Tabs tablet Take 1 tablet by mouth daily.       No Known Allergies  Consultations: ID  Procedures/Studies: Dg Chest 1 View  Result Date: 02/06/2018 CLINICAL DATA:  Pneumonia EXAM: CHEST  1 VIEW COMPARISON:  Chest CT 02/05/2018 FINDINGS:  1003 hours. AP portable exam shows no focal lung consolidation. No pulmonary edema or pleural effusion. The cardiopericardial silhouette is within normal limits for size. The visualized bony structures of the thorax are intact. Telemetry leads overlie the chest. IMPRESSION: Multifocal airspace disease seen on yesterday's CT chest not readily evident on x-ray. Electronically Signed   By: Kennith Center M.D.   On: 02/06/2018 11:50   Ct Angio Chest Pe W And/or Wo Contrast  Result Date: 02/05/2018 CLINICAL DATA:  PE suspected, intermediate prob, positive D-dimer. Question nodule on radiograph. EXAM: CT ANGIOGRAPHY CHEST WITH CONTRAST TECHNIQUE: Multidetector CT imaging of the chest was performed using the standard protocol during bolus administration of intravenous contrast. Multiplanar CT image reconstructions and MIPs were obtained to evaluate the vascular anatomy. CONTRAST:  ISOVUE-370 IOPAMIDOL (ISOVUE-370) INJECTION 76% COMPARISON:  Chest and abdominal radiographs earlier this day. FINDINGS: Cardiovascular: There are no filling defects within the pulmonary arteries to the segmental level to suggest pulmonary embolus. Distal most subsegmental branches are not well assessed. Thoracic aorta is normal in caliber without dissection. Heart is normal in size. No pericardial effusion. Mediastinum/Nodes: No enlarged mediastinal or hilar lymph nodes. Prominent left axillary nodes measure up to 8 mm short axis. Large hypodense 3.2 cm nodule in the left lobe of the thyroid gland. The esophagus is decompressed. Lungs/Pleura: Multiple peripheral ill-defined and nodular opacities within both lungs primarily in the lower lobes, largest in the left lower lobe abuts the pleura and accounts for radiographic abnormality. There is minimal surrounding ground-glass about these nodular densities. No pulmonary edema. Trachea and mainstem bronchi are patent. Bilateral pleural thickening without frank pleural effusion. Mild  dependent atelectasis in the lower lobes. Upper Abdomen: No acute abnormality. Musculoskeletal: Skin thickening with ill-defined 2 cm subcutaneous density in the left anterior chest wall just off midline. No soft tissue air. There are no acute or suspicious osseous abnormalities. Review of the MIP images confirms the above findings. IMPRESSION: 1. No pulmonary embolus. 2. Multifocal pulmonary opacities in both lungs primarily peripherally with some ground-glass density. Peripheral distribution raises concern for septic emboli or pulmonary infarct, however no pulmonary embolus is visualized. Infectious etiology with multifocal pneumonia also considered. 3. Skin thickening and ill-defined 2 cm subcutaneous density in the left anterior chest wall suspicious for abscess. Prominent left axillary nodes are likely reactive. 4. Left thyroid nodule measuring 3.2 cm. Recommend elective nonemergent thyroid ultrasound for characterization. These results were called by telephone at the time of interpretation on 02/05/2018 at 2:40 am to Dr. Ross Marcus , who verbally acknowledged these results. Electronically Signed   By: Narda Rutherford M.D.   On: 02/05/2018 02:41   Dg Abdomen Acute W/chest  Result Date: 02/05/2018 CLINICAL DATA:  Back and chest pain for a few days EXAM: DG ABDOMEN ACUTE W/ 1V CHEST COMPARISON:  None FINDINGS: Normal heart size, mediastinal contours, and pulmonary vascularity. Probable BILATERAL nipple shadows. Question pulmonary mass/nodule at lower LEFT chest 1.9 x 1.4 cm. Remaining lungs clear. No pleural effusion or pneumothorax. Nonobstructive bowel gas pattern. Scattered stool throughout colon. No bowel dilatation, bowel wall thickening, or free intraperitoneal air. No urinary tract calcification or acute osseous findings. IMPRESSION: Nonobstructive  bowel gas pattern. Question nodular density lower LEFT chest 1.9 x 1.4 cm; noncontrast CT chest recommended to exclude pulmonary nodule. Normal bowel  gas pattern Electronically Signed   By: Ulyses SouthwardMark  Boles M.D.   On: 02/05/2018 01:54   [place] Ekg Site Rite  Result Date: 02/08/2018 If Site Rite image not attached, placement could not be confirmed due to current cardiac rhythm.   (Echo, Carotid, EGD, Colonoscopy, ERCP)    Subjective:   Discharge Exam: Vitals:   02/08/18 1450 02/08/18 1455  BP: 116/65 (!) 116/55  Pulse: 77 78  Resp: 16 13  Temp:    SpO2: 97% 98%   Vitals:   02/08/18 1430 02/08/18 1440 02/08/18 1450 02/08/18 1455  BP: (!) 113/47 (!) 123/52 116/65 (!) 116/55  Pulse: 80 80 77 78  Resp: (!) 21 20 16 13   Temp:      TempSrc:      SpO2: 98% 98% 97% 98%  Weight:      Height:        General: Pt is alert, awake, not in acute distress Cardiovascular: RRR, S1/S2 +, no rubs, no gallops Respiratory: CTA bilaterally, no wheezing, no rhonchi Abdominal: Soft, NT, ND, bowel sounds + Extremities: no edema, no cyanosis    The results of significant diagnostics from this hospitalization (including imaging, microbiology, ancillary and laboratory) are listed below for reference.     Microbiology: Recent Results (from the past 240 hour(s))  Culture, Urine     Status: None   Collection Time: 02/04/18 11:03 PM  Result Value Ref Range Status   Specimen Description URINE, RANDOM  Final   Special Requests   Final    NONE Performed at [facility] [place], [address] [place], [place] [address]    Culture NO GROWTH  Final   Report Status 02/06/2018 FINAL  Final  Blood culture (routine x 2)     Status: Abnormal   Collection Time: 02/05/18  3:01 AM  Result Value Ref Range Status   Specimen Description   Final    BLOOD RIGHT ARM Performed at [facility] [place], [address] [place], [place] [address]    Special Requests   Final    BOTTLES DRAWN AEROBIC AND ANAEROBIC Blood Culture adequate volume Performed at [facility] [place], [address] [place], [place] [address]    Culture  Setup Time   Final     IN BOTH AEROBIC AND ANAEROBIC BOTTLES GRAM POSITIVE COCCI CRITICAL RESULT CALLED TO, READ BACK BY AND VERIFIED WITH: Peggyann JubaJ GRIMSLEY PHARMD 2359 02/05/18 A BROWNING    Culture STAPHYLOCOCCUS AUREUS (A)  Final   Report Status 02/08/2018 FINAL  Final   Organism ID, Bacteria STAPHYLOCOCCUS AUREUS  Final      Susceptibility   Staphylococcus aureus - MIC*    CIPROFLOXACIN <=0.5 SENSITIVE Sensitive     ERYTHROMYCIN >=8 RESISTANT Resistant     GENTAMICIN <=0.5 SENSITIVE Sensitive     OXACILLIN 0.5 SENSITIVE Sensitive     TETRACYCLINE <=1 SENSITIVE Sensitive     VANCOMYCIN 1 SENSITIVE Sensitive     TRIMETH/SULFA <=10 SENSITIVE Sensitive     CLINDAMYCIN <=0.25 SENSITIVE Sensitive     RIFAMPIN <=0.5 SENSITIVE Sensitive     Inducible Clindamycin NEGATIVE Sensitive     * STAPHYLOCOCCUS AUREUS  Blood Culture ID Panel (Reflexed)     Status: Abnormal   Collection Time: 02/05/18  3:01 AM  Result Value Ref Range Status   Enterococcus species NOT  DETECTED NOT DETECTED Final   Listeria monocytogenes NOT DETECTED NOT DETECTED Final   Staphylococcus species DETECTED (A) NOT DETECTED Final    Comment: CRITICAL RESULT CALLED TO, READ BACK BY AND VERIFIED WITH: Peggyann Juba PHARMD 2359 02/05/18 A BROWNING    Staphylococcus aureus DETECTED (A) NOT DETECTED Final    Comment: Methicillin (oxacillin) susceptible Staphylococcus aureus (MSSA). Preferred therapy is anti staphylococcal beta lactam antibiotic (Cefazolin or Nafcillin), unless clinically contraindicated. CRITICAL RESULT CALLED TO, READ BACK BY AND VERIFIED WITH: Peggyann Juba PHARMD 1610 02/05/18 A BROWNING    Methicillin resistance NOT DETECTED NOT DETECTED Final   Streptococcus species NOT DETECTED NOT DETECTED Final   Streptococcus agalactiae NOT DETECTED NOT DETECTED Final   Streptococcus pneumoniae NOT DETECTED NOT DETECTED Final   Streptococcus pyogenes NOT DETECTED NOT DETECTED Final   Acinetobacter baumannii NOT DETECTED NOT DETECTED Final    Enterobacteriaceae species NOT DETECTED NOT DETECTED Final   Enterobacter cloacae complex NOT DETECTED NOT DETECTED Final   Escherichia coli NOT DETECTED NOT DETECTED Final   Klebsiella oxytoca NOT DETECTED NOT DETECTED Final   Klebsiella pneumoniae NOT DETECTED NOT DETECTED Final   Proteus species NOT DETECTED NOT DETECTED Final   Serratia marcescens NOT DETECTED NOT DETECTED Final   Haemophilus influenzae NOT DETECTED NOT DETECTED Final   Neisseria meningitidis NOT DETECTED NOT DETECTED Final   Pseudomonas aeruginosa NOT DETECTED NOT DETECTED Final   Candida albicans NOT DETECTED NOT DETECTED Final   Candida glabrata NOT DETECTED NOT DETECTED Final   Candida krusei NOT DETECTED NOT DETECTED Final   Candida parapsilosis NOT DETECTED NOT DETECTED Final   Candida tropicalis NOT DETECTED NOT DETECTED Final    Comment: Performed at [facility], [address], [place], [place] [address]  Blood culture (routine x 2)     Status: Abnormal   Collection Time: 02/05/18  3:17 AM  Result Value Ref Range Status   Specimen Description BLOOD RIGHT HAND  Final   Special Requests   Final    BOTTLES DRAWN AEROBIC AND ANAEROBIC Blood Culture adequate volume   Culture  Setup Time   Final    GRAM POSITIVE COCCI ANAEROBIC BOTTLE ONLY CRITICAL RESULT CALLED TO, READ BACK BY AND VERIFIED WITH: Peggyann Juba PHARMD 2359 02/05/18 A BROWNING    Culture (A)  Final    STAPHYLOCOCCUS AUREUS SUSCEPTIBILITIES PERFORMED ON PREVIOUS CULTURE WITHIN THE LAST 5 DAYS.    Report Status 02/08/2018 FINAL  Final  Culture, blood (routine x 2)     Status: None (Preliminary result)   Collection Time: 02/06/18  8:38 AM  Result Value Ref Range Status   Specimen Description   Final    BLOOD LEFT ARM Performed at [facility], [address] [place], [place] [address]    Special Requests   Final    BOTTLES DRAWN AEROBIC AND ANAEROBIC Blood Culture adequate volume Performed at [facility], [address] [place], [place] [address]    Culture   Final    NO GROWTH 2 DAYS Performed at [facility], [address], [place], [place] [address]    Report Status PENDING  Incomplete  Culture, blood (routine x 2)     Status: None (Preliminary result)   Collection Time: 02/06/18  8:38 AM  Result Value Ref Range Status   Specimen Description   Final    BLOOD LEFT HAND Performed at [facility], [address] [place], [place] [address]  Special Requests   Final    BOTTLES DRAWN AEROBIC ONLY Blood Culture adequate volume Performed at [facility], [address], [place], [place] [address]    Culture   Final    NO GROWTH 2 DAYS Performed at [facility], [address], [place], [place] [address]    Report Status PENDING  Incomplete     Labs: BNP (last 3 results) No results for input(s): BNP in the last 8760 hours. Basic Metabolic Panel: Recent Labs  Lab 02/05/18 0109 02/05/18 0528 02/06/18 0552  NA 139 139 140  K 3.7 3.7 4.1  CL 106 106 108  CO2 24 23 24   GLUCOSE 101* 138* 90  BUN <5* <5* 6  CREATININE 0.64 0.66 0.66  CALCIUM 9.1 8.9 8.7*   Liver Function Tests: No results for input(s): AST, ALT, ALKPHOS, BILITOT, PROT, ALBUMIN in the last 168 hours. No results for input(s): LIPASE, AMYLASE in the last 168 hours. No results for input(s): AMMONIA in the last 168 hours. CBC: Recent Labs  Lab 02/05/18 0109 02/05/18 0528 02/06/18 0552  WBC 11.1* 11.9* 6.3  NEUTROABS 8.4  --   --   HGB 12.4 11.9* 10.8*  HCT 36.3 35.2* 31.9*  MCV 93.1 93.1 93.8  PLT 176 203 178   Cardiac Enzymes: No results for input(s): CKTOTAL, CKMB, CKMBINDEX, TROPONINI in the last 168 hours. BNP: Invalid input(s): POCBNP CBG: No results for input(s): GLUCAP in the last 168 hours. D-Dimer No results for input(s): DDIMER in the last 72 hours. Hgb A1c No results for input(s): HGBA1C in the last 72 hours. Lipid  Profile No results for input(s): CHOL, HDL, LDLCALC, TRIG, CHOLHDL, LDLDIRECT in the last 72 hours. Thyroid function studies No results for input(s): TSH, T4TOTAL, T3FREE, THYROIDAB in the last 72 hours.  Invalid input(s): FREET3 Anemia work up No results for input(s): VITAMINB12, FOLATE, FERRITIN, TIBC, IRON, RETICCTPCT in the last 72 hours. Urinalysis    Component Value Date/Time   COLORURINE YELLOW 02/04/2018 2303   APPEARANCEUR CLEAR 02/04/2018 2303   LABSPEC 1.020 02/04/2018 2303   PHURINE 5.5 02/04/2018 2303   GLUCOSEU NEGATIVE 02/04/2018 2303   HGBUR MODERATE (A) 02/04/2018 2303   BILIRUBINUR NEGATIVE 02/04/2018 2303   KETONESUR NEGATIVE 02/04/2018 2303   PROTEINUR NEGATIVE 02/04/2018 2303   NITRITE NEGATIVE 02/04/2018 2303   LEUKOCYTESUR NEGATIVE 02/04/2018 2303   Sepsis Labs Invalid input(s): PROCALCITONIN,  WBC,  LACTICIDVEN Microbiology Recent Results (from the past 240 hour(s))  Culture, Urine     Status: None   Collection Time: 02/04/18 11:03 PM  Result Value Ref Range Status   Specimen Description URINE, RANDOM  Final   Special Requests   Final    NONE Performed at [facility], [address] [place], [place] [address]    Culture NO GROWTH  Final   Report Status 02/06/2018 FINAL  Final  Blood culture (routine x 2)     Status: Abnormal   Collection Time: 02/05/18  3:01 AM  Result Value Ref Range Status   Specimen Description   Final    BLOOD RIGHT ARM Performed at [facility], [address] [place], [place] [address]    Special Requests   Final    BOTTLES DRAWN AEROBIC AND ANAEROBIC Blood Culture adequate volume Performed at [facility], [address] [place], [place] [address]    Culture  Setup Time   Final    IN BOTH AEROBIC  AND ANAEROBIC BOTTLES GRAM POSITIVE COCCI CRITICAL RESULT CALLED TO, READ BACK BY AND VERIFIED WITH: Peggyann Juba PHARMD 0981 02/05/18 A BROWNING    Culture STAPHYLOCOCCUS AUREUS (A)  Final    Report Status 02/08/2018 FINAL  Final   Organism ID, Bacteria STAPHYLOCOCCUS AUREUS  Final      Susceptibility   Staphylococcus aureus - MIC*    CIPROFLOXACIN <=0.5 SENSITIVE Sensitive     ERYTHROMYCIN >=8 RESISTANT Resistant     GENTAMICIN <=0.5 SENSITIVE Sensitive     OXACILLIN 0.5 SENSITIVE Sensitive     TETRACYCLINE <=1 SENSITIVE Sensitive     VANCOMYCIN 1 SENSITIVE Sensitive     TRIMETH/SULFA <=10 SENSITIVE Sensitive     CLINDAMYCIN <=0.25 SENSITIVE Sensitive     RIFAMPIN <=0.5 SENSITIVE Sensitive     Inducible Clindamycin NEGATIVE Sensitive     * STAPHYLOCOCCUS AUREUS  Blood Culture ID Panel (Reflexed)     Status: Abnormal   Collection Time: 02/05/18  3:01 AM  Result Value Ref Range Status   Enterococcus species NOT DETECTED NOT DETECTED Final   Listeria monocytogenes NOT DETECTED NOT DETECTED Final   Staphylococcus species DETECTED (A) NOT DETECTED Final    Comment: CRITICAL RESULT CALLED TO, READ BACK BY AND VERIFIED WITH: Peggyann Juba PHARMD 2359 02/05/18 A BROWNING    Staphylococcus aureus DETECTED (A) NOT DETECTED Final    Comment: Methicillin (oxacillin) susceptible Staphylococcus aureus (MSSA). Preferred therapy is anti staphylococcal beta lactam antibiotic (Cefazolin or Nafcillin), unless clinically contraindicated. CRITICAL RESULT CALLED TO, READ BACK BY AND VERIFIED WITH: Peggyann Juba PHARMD 1914 02/05/18 A BROWNING    Methicillin resistance NOT DETECTED NOT DETECTED Final   Streptococcus species NOT DETECTED NOT DETECTED Final   Streptococcus agalactiae NOT DETECTED NOT DETECTED Final   Streptococcus pneumoniae NOT DETECTED NOT DETECTED Final   Streptococcus pyogenes NOT DETECTED NOT DETECTED Final   Acinetobacter baumannii NOT DETECTED NOT DETECTED Final   Enterobacteriaceae species NOT DETECTED NOT DETECTED Final   Enterobacter cloacae complex NOT DETECTED NOT DETECTED Final   Escherichia coli NOT DETECTED NOT DETECTED Final   Klebsiella oxytoca NOT DETECTED NOT  DETECTED Final   Klebsiella pneumoniae NOT DETECTED NOT DETECTED Final   Proteus species NOT DETECTED NOT DETECTED Final   Serratia marcescens NOT DETECTED NOT DETECTED Final   Haemophilus influenzae NOT DETECTED NOT DETECTED Final   Neisseria meningitidis NOT DETECTED NOT DETECTED Final   Pseudomonas aeruginosa NOT DETECTED NOT DETECTED Final   Candida albicans NOT DETECTED NOT DETECTED Final   Candida glabrata NOT DETECTED NOT DETECTED Final   Candida krusei NOT DETECTED NOT DETECTED Final   Candida parapsilosis NOT DETECTED NOT DETECTED Final   Candida tropicalis NOT DETECTED NOT DETECTED Final    Comment: Performed at [facility], [address], [place], [place] [address]  Blood culture (routine x 2)     Status: Abnormal   Collection Time: 02/05/18  3:17 AM  Result Value Ref Range Status   Specimen Description BLOOD RIGHT HAND  Final   Special Requests   Final    BOTTLES DRAWN AEROBIC AND ANAEROBIC Blood Culture adequate volume   Culture  Setup Time   Final    GRAM POSITIVE COCCI ANAEROBIC BOTTLE ONLY CRITICAL RESULT CALLED TO, READ BACK BY AND VERIFIED WITH: Peggyann Juba PHARMD 2359 02/05/18 A BROWNING    Culture (A)  Final    STAPHYLOCOCCUS AUREUS SUSCEPTIBILITIES PERFORMED ON PREVIOUS CULTURE WITHIN THE LAST 5 DAYS.    Report Status 02/08/2018 FINAL  Final  Culture, blood (routine x 2)     Status: None (Preliminary result)   Collection Time: 02/06/18  8:38 AM  Result Value Ref Range Status   Specimen Description   Final    BLOOD LEFT ARM Performed at [facility], [address], [place], [place] [address]    Special Requests   Final    BOTTLES DRAWN AEROBIC AND ANAEROBIC Blood Culture adequate volume Performed at [facility], [address], [place], [place] [address]    Culture   Final    NO GROWTH 2 DAYS Performed at [facility], [address] [place], [place] [address]    Report Status PENDING  Incomplete   Culture, blood (routine x 2)     Status: None (Preliminary result)   Collection Time: 02/06/18  8:38 AM  Result Value Ref Range Status   Specimen Description   Final    BLOOD LEFT HAND Performed at [facility], [address], [place], [place] [address]    Special Requests   Final    BOTTLES DRAWN AEROBIC ONLY Blood Culture adequate volume Performed at [facility], [address] [place], [place] [address]    Culture   Final    NO GROWTH 2 DAYS Performed at [facility], [address] [place], [place] [address]    Report Status PENDING  Incomplete     Time coordinating discharge: 35 minutes  SIGNED:   Alwyn Ren, MD  [facility] 02/08/2018, 4:24 PM Pager   If 7PM-7AM, please contact night-coverage [email or web address] Password [email or web address]

## 2018-02-08 NOTE — Transfer of Care (Signed)
Immediate Anesthesia Transfer of Care Note  Patient: Jessica Pugh  Procedure(s) Performed: TRANSESOPHAGEAL ECHOCARDIOGRAM (TEE) (N/A )  Patient Location: PACU  Anesthesia Type:MAC  Level of Consciousness: awake, alert  and sedated  Airway & Oxygen Therapy: Patient connected to nasal cannula oxygen  Post-op Assessment: Post -op Vital signs reviewed and stable  Post vital signs: stable  Last Vitals:  Vitals Value Taken Time  BP    Temp    Pulse    Resp    SpO2      Last Pain:  Vitals:   02/08/18 1300  TempSrc: Oral  PainSc: 0-No pain      Patients Stated Pain Goal: 3 (56/43/32 9518)  Complications: No apparent anesthesia complications

## 2018-02-08 NOTE — CV Procedure (Signed)
Procedure: TEE  Indication: Endocarditis  Sedation: Propofol per anesthesiology.   Findings: Normal LV size and wall thickness.  EF 60-65%, normal wall motion.  Normal RV size and systolic function.  Normal left atrial size, no LA appendage thrombus. Normal right atrial size.  No PFO or ASD by color doppler.  No significant tricuspid regurgitation, no TV vegetation.  No significant mitral regurgitation, no MV vegetation.  Trileaflet aortic valve, no stenosis or regurgitation.  Mild aortic insufficiency.  No AoV vegetation.  Trivial PI, no PV vegetation.  Normal caliber aorta with no plaque.   Impression: No evidence for endocarditis.   Marca AnconaDalton McLean 02/08/2018 2:26 PM

## 2018-02-08 NOTE — Progress Notes (Addendum)
Patient ID: Jessica Pugh, female   DOB: Aug 15, 1985, 32 y.o.   MRN: [id number]         [facility] for Infectious Disease  Date of Admission:  02/05/2018   Total days of antibiotics 5        Day 3 cefazolin         ASSESSMENT: She is improving on cefazolin for a chest wall abscess complicated by MSSA bacteremia.  She is awaiting PICC placement.  From my perspective she can be discharged once the PICC is placed.  TEE showed only mild aortic insufficiency.  No vegetations were seen.  I plan on treating her a total of 3 weeks.  PLAN: 1. Continue cefazolin 2. Discharge home after PICC placement 3. I will sign off now  Diagnosis: Bacteremia  Culture Result: MSSA  No Known Allergies  OPAT Orders Discharge antibiotics: Per pharmacy protocol cefazolin  Duration: 3 weeks End Date: 02/25/2018  [facility] Care Per Protocol:  Labs weekly while on IV antibiotics: _x_ CBC with differential _x_ BMP __ CMP __ CRP __ ESR __ Vancomycin trough  _x_ Please pull PIC at completion of IV antibiotics __ Please leave PIC in place until doctor has seen patient or been notified  Fax weekly labs to [phone]  Clinic Follow Up Appt: 02/26/2018  Principal Problem:   Bacteremia due to methicillin susceptible Staphylococcus aureus (MSSA) Active Problems:   Multifocal pneumonia   Abscess of chest wall   Aortic regurgitation   Left thyroid nodule   Normocytic anemia   H/O cystic acne   Anxiety   Scheduled Meds: . enoxaparin (LOVENOX) injection  40 mg Subcutaneous Q24H  . guaiFENesin  600 mg Oral BID  . prenatal vitamin w/FE, FA  1 tablet Oral Daily  . sodium chloride flush  3 mL Intravenous Q12H   Continuous Infusions: . sodium chloride    . sodium chloride 50 mL/hr at 02/08/18 1038  .  ceFAZolin (ANCEF) IV 2 g (02/08/18 0619)   PRN Meds:.sodium chloride, acetaminophen **OR** acetaminophen, albuterol, ondansetron **OR** ondansetron (ZOFRAN) IV, zolpidem   SUBJECTIVE: She  says that she is quite anxious about the TEE today.  Review of Systems: Review of Systems  Constitutional: Negative for chills, diaphoresis, fever, malaise/fatigue and weight loss.  HENT: Negative for congestion and sore throat.   Respiratory: Negative for cough, sputum production and shortness of breath.   Cardiovascular: Negative for chest pain.  Gastrointestinal: Negative for abdominal pain, diarrhea, heartburn, nausea and vomiting.  Genitourinary: Negative for dysuria and frequency.  Musculoskeletal: Positive for back pain. Negative for joint pain and myalgias.  Skin: Negative for rash.  Neurological: Negative for dizziness and headaches.  Psychiatric/Behavioral: Negative for depression. The patient is nervous/anxious.     No Known Allergies  OBJECTIVE: Vitals:   02/07/18 0520 02/07/18 1231 02/07/18 2105 02/08/18 0615  BP: 113/69 108/62 118/73 118/71  Pulse: 84 (!) 59 71 88  Resp: '18  16 16  '$ Temp: 98.4 F (36.9 C) 98.3 F (36.8 C) 98.5 F (36.9 C) 98.5 F (36.9 C)  TempSrc: Oral Oral Oral Oral  SpO2: 100% 100% 100% 100%  Weight:      Height:       Body mass index is 21.9 kg/m.  Physical Exam  Constitutional: She is oriented to person, place, and time.  She appears comfortable sitting up in bed visiting with her extended family.  HENT:  Mouth/Throat: No oropharyngeal exudate.  Eyes: Conjunctivae are normal.  Cardiovascular: Normal rate, regular rhythm and  normal heart sounds.  No murmur heard. Pulmonary/Chest: Effort normal and breath sounds normal.    Abdominal: Soft. She exhibits no distension and no mass. There is no tenderness.  Musculoskeletal: Normal range of motion. She exhibits no edema or tenderness.  Neurological: She is alert and oriented to person, place, and time.  Skin: No rash noted.  Psychiatric:  She is anxious.    Lab Results Lab Results  Component Value Date   WBC 6.3 02/06/2018   HGB 10.8 (L) 02/06/2018   HCT 31.9 (L) 02/06/2018    MCV 93.8 02/06/2018   PLT 178 02/06/2018    Lab Results  Component Value Date   CREATININE 0.66 02/06/2018   BUN 6 02/06/2018   NA 140 02/06/2018   K 4.1 02/06/2018   CL 108 02/06/2018   CO2 24 02/06/2018    Lab Results  Component Value Date   ALT 13 08/30/2007   AST 43 (H) 08/30/2007   ALKPHOS 165 (H) 08/30/2007   BILITOT 0.3 08/30/2007     Microbiology: Recent Results (from the past 240 hour(s))  Culture, Urine     Status: None   Collection Time: 02/04/18 11:03 PM  Result Value Ref Range Status   Specimen Description URINE, RANDOM  Final   Special Requests   Final    NONE Performed at [facility], [address] [place], [place] [address]    Culture NO GROWTH  Final   Report Status 02/06/2018 FINAL  Final  Blood culture (routine x 2)     Status: Abnormal   Collection Time: 02/05/18  3:01 AM  Result Value Ref Range Status   Specimen Description   Final    BLOOD RIGHT ARM Performed at [facility], [address] [place], [place] [address]    Special Requests   Final    BOTTLES DRAWN AEROBIC AND ANAEROBIC Blood Culture adequate volume Performed at [facility], [address] [place], [place] [address]    Culture  Setup Time   Final    IN BOTH AEROBIC AND ANAEROBIC BOTTLES GRAM POSITIVE COCCI CRITICAL RESULT CALLED TO, READ BACK BY AND VERIFIED WITH: Lavell Luster PHARMD 2359 02/05/18 A BROWNING    Culture STAPHYLOCOCCUS AUREUS (A)  Final   Report Status 02/08/2018 FINAL  Final   Organism ID, Bacteria STAPHYLOCOCCUS AUREUS  Final      Susceptibility   Staphylococcus aureus - MIC*    CIPROFLOXACIN <=0.5 SENSITIVE Sensitive     ERYTHROMYCIN >=8 RESISTANT Resistant     GENTAMICIN <=0.5 SENSITIVE Sensitive     OXACILLIN 0.5 SENSITIVE Sensitive     TETRACYCLINE <=1 SENSITIVE Sensitive     VANCOMYCIN 1 SENSITIVE Sensitive     TRIMETH/SULFA <=10 SENSITIVE Sensitive     CLINDAMYCIN <=0.25 SENSITIVE Sensitive     RIFAMPIN <=0.5  SENSITIVE Sensitive     Inducible Clindamycin NEGATIVE Sensitive     * STAPHYLOCOCCUS AUREUS  Blood Culture ID Panel (Reflexed)     Status: Abnormal   Collection Time: 02/05/18  3:01 AM  Result Value Ref Range Status   Enterococcus species NOT DETECTED NOT DETECTED Final   Listeria monocytogenes NOT DETECTED NOT DETECTED Final   Staphylococcus species DETECTED (A) NOT DETECTED Final    Comment: CRITICAL RESULT CALLED TO, READ BACK BY AND VERIFIED WITHLavell Luster PHARMD 2359 02/05/18 A BROWNING    Staphylococcus aureus DETECTED (A) NOT DETECTED Final    Comment: Methicillin (oxacillin) susceptible Staphylococcus  aureus (MSSA). Preferred therapy is anti staphylococcal beta lactam antibiotic (Cefazolin or Nafcillin), unless clinically contraindicated. CRITICAL RESULT CALLED TO, READ BACK BY AND VERIFIED WITH: Lavell Luster PHARMD [id number] 02/05/18 A BROWNING    Methicillin resistance NOT DETECTED NOT DETECTED Final   Streptococcus species NOT DETECTED NOT DETECTED Final   Streptococcus agalactiae NOT DETECTED NOT DETECTED Final   Streptococcus pneumoniae NOT DETECTED NOT DETECTED Final   Streptococcus pyogenes NOT DETECTED NOT DETECTED Final   Acinetobacter baumannii NOT DETECTED NOT DETECTED Final   Enterobacteriaceae species NOT DETECTED NOT DETECTED Final   Enterobacter cloacae complex NOT DETECTED NOT DETECTED Final   Escherichia coli NOT DETECTED NOT DETECTED Final   Klebsiella oxytoca NOT DETECTED NOT DETECTED Final   Klebsiella pneumoniae NOT DETECTED NOT DETECTED Final   Proteus species NOT DETECTED NOT DETECTED Final   Serratia marcescens NOT DETECTED NOT DETECTED Final   Haemophilus influenzae NOT DETECTED NOT DETECTED Final   Neisseria meningitidis NOT DETECTED NOT DETECTED Final   Pseudomonas aeruginosa NOT DETECTED NOT DETECTED Final   Candida albicans NOT DETECTED NOT DETECTED Final   Candida glabrata NOT DETECTED NOT DETECTED Final   Candida krusei NOT DETECTED NOT DETECTED  Final   Candida parapsilosis NOT DETECTED NOT DETECTED Final   Candida tropicalis NOT DETECTED NOT DETECTED Final    Comment: Performed at [facility], [place] [address], [place], [place] [address]  Blood culture (routine x 2)     Status: Abnormal   Collection Time: 02/05/18  3:17 AM  Result Value Ref Range Status   Specimen Description BLOOD RIGHT HAND  Final   Special Requests   Final    BOTTLES DRAWN AEROBIC AND ANAEROBIC Blood Culture adequate volume   Culture  Setup Time   Final    GRAM POSITIVE COCCI ANAEROBIC BOTTLE ONLY CRITICAL RESULT CALLED TO, READ BACK BY AND VERIFIED WITH: Lavell Luster PHARMD 2359 02/05/18 A BROWNING    Culture (A)  Final    STAPHYLOCOCCUS AUREUS SUSCEPTIBILITIES PERFORMED ON PREVIOUS CULTURE WITHIN THE LAST 5 DAYS.    Report Status 02/08/2018 FINAL  Final  Culture, blood (routine x 2)     Status: None (Preliminary result)   Collection Time: 02/06/18  8:38 AM  Result Value Ref Range Status   Specimen Description   Final    BLOOD LEFT ARM Performed at [facility] [address], [place], [place] [address]    Special Requests   Final    BOTTLES DRAWN AEROBIC AND ANAEROBIC Blood Culture adequate volume Performed at [facility] [address] [place], [place] [address]    Culture   Final    NO GROWTH 2 DAYS Performed at [facility] [address] [place], [place] [address]    Report Status PENDING  Incomplete  Culture, blood (routine x 2)     Status: None (Preliminary result)   Collection Time: 02/06/18  8:38 AM  Result Value Ref Range Status   Specimen Description   Final    BLOOD LEFT HAND Performed at [facility] [address], [place], [place] [address]    Special Requests   Final    BOTTLES DRAWN AEROBIC ONLY Blood Culture adequate volume Performed at [facility] [address], [place], [place] [address]    Culture   Final    NO GROWTH 2  DAYS Performed at [facility] [address], [place],  [address]  Report Status PENDING  Incomplete    Michel Bickers, MD [facility] for Infectious [place] [facility] [phone] pager   [phone] cell 02/08/2018, 10:59 AM

## 2018-02-08 NOTE — Progress Notes (Signed)
[facility] today following TEE and PICC placement. Pt and family have been taught IV ABX administration and feel comfortable with 10 pm dose tonight.  [facility] will plan to see pt tomorrow for 8 AM dose.  If patient discharges after hours, please call [phone].   Sedalia Mutaamela S Chandler 02/08/2018, 2:49 PM

## 2018-02-08 NOTE — Anesthesia Preprocedure Evaluation (Addendum)
Anesthesia Evaluation  Patient identified by MRN, date of birth, ID band Patient awake    Reviewed: Allergy & Precautions, NPO status , Patient's Chart, lab work & pertinent test results  History of Anesthesia Complications Negative for: history of anesthetic complications  Airway Mallampati: II  TM Distance: >3 FB Neck ROM: Full    Dental  (+) Dental Advisory Given, Teeth Intact   Pulmonary pneumonia,    breath sounds clear to auscultation       Cardiovascular  Rhythm:Regular Rate:Tachycardia   '19 TTE - EF 55% to 60%. Mild to moderate AI directed centrally in the LVOT.   Neuro/Psych Anxiety negative neurological ROS     GI/Hepatic negative GI ROS, Neg liver ROS,   Endo/Other  negative endocrine ROS  Renal/GU negative Renal ROS  negative genitourinary   Musculoskeletal negative musculoskeletal ROS (+)   Abdominal   Peds  Hematology  (+) anemia ,   Anesthesia Other Findings MSSA bacteremia Chest wall abscess  Reproductive/Obstetrics  Hx PIH                             Anesthesia Physical Anesthesia Plan  ASA: II  Anesthesia Plan: MAC   Post-op Pain Management:    Induction: Intravenous  PONV Risk Score and Plan: 2 and Treatment may vary due to age or medical condition and Propofol infusion  Airway Management Planned: Mask and Natural Airway  Additional Equipment: None  Intra-op Plan:   Post-operative Plan:   Informed Consent: I have reviewed the patients History and Physical, chart, labs and discussed the procedure including the risks, benefits and alternatives for the proposed anesthesia with the patient or authorized representative who has indicated his/her understanding and acceptance.     Plan Discussed with: CRNA and Anesthesiologist  Anesthesia Plan Comments:        Anesthesia Quick Evaluation

## 2018-02-08 NOTE — Anesthesia Procedure Notes (Signed)
Procedure Name: MAC Date/Time: 02/08/2018 2:05 PM Performed by: Marsa Aris, CRNA Pre-anesthesia Checklist: Patient identified, Emergency Drugs available, Suction available, Patient being monitored and Timeout performed Patient Re-evaluated:Patient Re-evaluated prior to induction Oxygen Delivery Method: Nasal cannula Preoxygenation: Pre-oxygenation with 100% oxygen Induction Type: IV induction Airway Equipment and Method: Bite block Placement Confirmation: CO2 detector Dental Injury: Teeth and Oropharynx as per pre-operative assessment

## 2018-02-08 NOTE — Progress Notes (Signed)
Peripherally Inserted Central Catheter/Midline Placement  The IV Nurse has discussed with the patient and/or persons authorized to consent for the patient, the purpose of this procedure and the potential benefits and risks involved with this procedure.  The benefits include less needle sticks, lab draws from the catheter, and the patient may be discharged home with the catheter. Risks include, but not limited to, infection, bleeding, blood clot (thrombus formation), and puncture of an artery; nerve damage and irregular heartbeat and possibility to perform a PICC exchange if needed/ordered by physician.  Alternatives to this procedure were also discussed.  Bard Power PICC patient education guide, fact sheet on infection prevention and patient information card has been provided to patient /or left at bedside.    PICC/Midline Placement Documentation  PICC Single Lumen 02/08/18 PICC Left Brachial 37 cm 1 cm (Active)  Indication for Insertion or Continuance of Line Home intravenous therapies (PICC only) 02/08/2018  4:24 PM  Exposed Catheter (cm) 0 cm 02/08/2018  4:24 PM  Site Assessment Clean;Dry;Intact 02/08/2018  4:24 PM  Line Status Flushed;Saline locked;Blood return noted 02/08/2018  4:24 PM  Dressing Type Transparent 02/08/2018  4:24 PM  Dressing Status Clean;Dry;Intact;Antimicrobial disc in place 02/08/2018  4:24 PM  Dressing Intervention New dressing 02/08/2018  4:24 PM  Dressing Change Due 02/15/18 02/08/2018  4:24 PM       Ethelda Chickurrie, Fay Bagg Robert 02/08/2018, 4:25 PM

## 2018-02-10 ENCOUNTER — Encounter (HOSPITAL_COMMUNITY): Payer: Self-pay | Admitting: Cardiology

## 2018-02-11 LAB — CULTURE, BLOOD (ROUTINE X 2)
CULTURE: NO GROWTH
CULTURE: NO GROWTH
Special Requests: ADEQUATE
Special Requests: ADEQUATE

## 2018-02-11 NOTE — Anesthesia Postprocedure Evaluation (Signed)
Anesthesia Post Note  Patient: Jessica Pugh  Procedure(s) Performed: TRANSESOPHAGEAL ECHOCARDIOGRAM (TEE) (N/A )     Patient location during evaluation: PACU Anesthesia Type: MAC Level of consciousness: awake and alert Pain management: pain level controlled Vital Signs Assessment: post-procedure vital signs reviewed and stable Respiratory status: spontaneous breathing, nonlabored ventilation, respiratory function stable and patient connected to nasal cannula oxygen Cardiovascular status: stable and blood pressure returned to baseline Postop Assessment: no apparent nausea or vomiting Anesthetic complications: no    Last Vitals:  Vitals:   02/08/18 1450 02/08/18 1455  BP: 116/65 (!) 116/55  Pulse: 77 78  Resp: 16 13  Temp:    SpO2: 97% 98%    Last Pain:  Vitals:   02/08/18 1455  TempSrc:   PainSc: 0-No pain                 Jung Yurchak S

## 2018-02-26 ENCOUNTER — Encounter: Payer: Self-pay | Admitting: Internal Medicine

## 2018-02-26 ENCOUNTER — Telehealth: Payer: Self-pay | Admitting: Pharmacist

## 2018-02-26 ENCOUNTER — Ambulatory Visit (INDEPENDENT_AMBULATORY_CARE_PROVIDER_SITE_OTHER): Payer: 59 | Admitting: Internal Medicine

## 2018-02-26 DIAGNOSIS — R7881 Bacteremia: Secondary | ICD-10-CM | POA: Diagnosis not present

## 2018-02-26 DIAGNOSIS — B9561 Methicillin susceptible Staphylococcus aureus infection as the cause of diseases classified elsewhere: Secondary | ICD-10-CM

## 2018-02-26 DIAGNOSIS — E041 Nontoxic single thyroid nodule: Secondary | ICD-10-CM | POA: Diagnosis not present

## 2018-02-26 NOTE — Assessment & Plan Note (Signed)
An asymptomatic, left thyroid nodule was found incidentally on her recent chest CT scan.  I instructed her to follow-up with her primary care provider for further evaluation.

## 2018-02-26 NOTE — Telephone Encounter (Signed)
Called and gave verbal orders to Debbie at [facility] to pull patient's PICC line as soon as possible per Dr. Orvan Falconer. Debbie verbalized understanding.

## 2018-02-26 NOTE — Assessment & Plan Note (Signed)
I am hopeful that her MSSA infection has been cured following incision and drainage of her chest wall abscess in 3 weeks of IV antibiotics.  I will have her PICC line removed.  She will follow-up in 4 weeks.

## 2018-02-26 NOTE — Progress Notes (Signed)
[facility]  Patient Active Problem List   Diagnosis Date Noted  . Bacteremia due to methicillin susceptible Staphylococcus aureus (MSSA) 02/06/2018    Priority: High  . Aortic regurgitation 02/06/2018    Priority: High  . Multifocal pneumonia 02/05/2018    Priority: High  . Abscess of chest wall 02/05/2018    Priority: High  . Normocytic anemia 02/06/2018  . H/O cystic acne 02/06/2018  . Anxiety 02/06/2018  . Left thyroid nodule 02/05/2018    Patient's Medications  New Prescriptions   No medications on file  Previous Medications   HYDROCORTISONE CREAM 1 %    Apply 1 application topically 2 (two) times daily as needed for itching.   PRENATAL VITAMIN W/FE, FA (PRENATAL 1 + 1) 27-1 MG TABS    Take 1 tablet by mouth daily.    Modified Medications   No medications on file  Discontinued Medications   No medications on file    Subjective: Ms. [place] is in for her hospital follow-up visit.  She was hospitalized last month with a left anterior chest wall abscess complicated by MSSA bacteremia and multifocal pneumonia.  Repeat blood cultures became negative within 30 hours of starting antibiotics.  There is no evidence of endocarditis by exam or TEE.  She completed 3 weeks of IV cefazolin yesterday.  She had some mild nausea when she first went home but this resolved spontaneously.  She did not have any other problems tolerating her antibiotic or PICC.  She is feeling much better.  Review of Systems: Review of Systems  Constitutional: Negative for chills, diaphoresis, fever and malaise/fatigue.  Respiratory: Negative for cough, sputum production and shortness of breath.   Cardiovascular: Negative for chest pain.  Gastrointestinal: Negative for abdominal pain, diarrhea, nausea and vomiting.  Musculoskeletal: Negative for joint pain.  Skin: Negative for rash.    Past Medical History:  Diagnosis Date  . Cystic acne   . History of chicken pox    . Pregnancy induced hypertension    first pregnancy    Social History   Tobacco Use  . Smoking status: Never Smoker  . Smokeless tobacco: Never Used  Substance Use Topics  . Alcohol use: No  . Drug use: No    Family History  Problem Relation Age of Onset  . Kidney Stones Mother   . Kidney Stones Father   . Prostate cancer Father     No Known Allergies  Objective: Vitals:   02/26/18 1611  BP: 106/69  Pulse: 87  Temp: 98.1 F (36.7 C)  Weight: 113 lb (51.3 kg)  Height: 5\' 1"  (1.549 m)   Body mass index is 21.35 kg/m.  Physical Exam  Constitutional: She is oriented to person, place, and time.  She is in good spirits.  She is accompanied by her husband.  Cardiovascular: Normal rate, regular rhythm and normal heart sounds.  No murmur heard. Pulmonary/Chest: Effort normal and breath sounds normal. She has no rales.    Neurological: She is alert and oriented to person, place, and time.  Skin: No rash noted.  Left arm PICC site looks good.  Psychiatric: She has a normal mood and affect.    Lab Results    Problem List Items Addressed This Visit      High   Bacteremia due to methicillin susceptible Staphylococcus aureus (MSSA)    I am hopeful that her MSSA infection has been cured following incision and drainage of  her chest wall abscess in 3 weeks of IV antibiotics.  I will have her PICC line removed.  She will follow-up in 4 weeks.        Unprioritized   Left thyroid nodule    An asymptomatic, left thyroid nodule was found incidentally on her recent chest CT scan.  I instructed her to follow-up with her primary care provider for further evaluation.          Cliffton Asters, MD [facility] for Infectious Disease [facility] [phone] pager   [phone] cell 02/26/2018, 4:37 PM

## 2018-03-28 ENCOUNTER — Ambulatory Visit (INDEPENDENT_AMBULATORY_CARE_PROVIDER_SITE_OTHER): Payer: 59 | Admitting: Internal Medicine

## 2018-03-28 ENCOUNTER — Encounter: Payer: Self-pay | Admitting: Internal Medicine

## 2018-03-28 DIAGNOSIS — L700 Acne vulgaris: Secondary | ICD-10-CM

## 2018-03-28 DIAGNOSIS — R7881 Bacteremia: Secondary | ICD-10-CM | POA: Diagnosis not present

## 2018-03-28 MED ORDER — CEPHALEXIN 500 MG PO CAPS: 500.0000 mg | ORAL_CAPSULE | Freq: Three times a day (TID) | ORAL | 0 refills | Status: DC

## 2018-03-28 MED ORDER — CLINDAMYCIN PHOSPHATE 1 % EX LOTN: TOPICAL_LOTION | Freq: Two times a day (BID) | CUTANEOUS | 0 refills | Status: AC

## 2018-03-28 NOTE — Assessment & Plan Note (Signed)
Given recent history I will be more aggressive in treating the enlarging nodule on her right cheek.  We will put her on oral cephalexin and topical clindamycin and see her back in 1 week.  She is scheduled to be in her cousin's wedding this weekend.

## 2018-03-28 NOTE — Progress Notes (Signed)
[facility]  Patient Active Problem List   Diagnosis Date Noted  . Bacteremia due to methicillin susceptible Staphylococcus aureus (MSSA) 02/06/2018    Priority: High  . Aortic regurgitation 02/06/2018    Priority: High  . Cystic acne 02/06/2018    Priority: High  . Multifocal pneumonia 02/05/2018    Priority: High  . Abscess of chest wall 02/05/2018    Priority: High  . Normocytic anemia 02/06/2018  . Anxiety 02/06/2018  . Left thyroid nodule 02/05/2018    Patient's Medications  New Prescriptions   CEPHALEXIN (KEFLEX) 500 MG CAPSULE    Take 1 capsule (500 mg total) by mouth 3 (three) times daily.   CLINDAMYCIN (CLEOCIN T) 1 % LOTION    Apply topically 2 (two) times daily.  Previous Medications   HYDROCORTISONE CREAM 1 %    Apply 1 application topically 2 (two) times daily as needed for itching.   PRENATAL VITAMIN W/FE, FA (PRENATAL 1 + 1) 27-1 MG TABS    Take 1 tablet by mouth daily.    Modified Medications   No medications on file  Discontinued Medications   No medications on file    Subjective: Jessica Pugh is in for her routine follow-up visit.  She completed 3 weeks of IV cefazolin on 02/26/2018 for her chest wall abscess, multifocal pneumonia and MSSA bacteremia.  She had no evidence of endocarditis by exam or TEE.  She did very well until 2 days ago when she began to notice an erythematous nodule on her right cheek.  She did not think much of it because she has a history of cystic acne.  Yesterday she developed a second lesion on her left cheek and the right cheek lesion began to swell and become painful.  Review of Systems: Review of Systems  Constitutional: Negative for chills, diaphoresis and fever.  Respiratory: Negative for cough, sputum production and shortness of breath.   Gastrointestinal: Negative for abdominal pain, diarrhea, nausea and vomiting.    Past Medical History:  Diagnosis Date  . Cystic acne   . History of chicken  pox   . Pregnancy induced hypertension    first pregnancy    Social History   Tobacco Use  . Smoking status: Never Smoker  . Smokeless tobacco: Never Used  Substance Use Topics  . Alcohol use: No  . Drug use: No    Family History  Problem Relation Age of Onset  . Kidney Stones Mother   . Kidney Stones Father   . Prostate cancer Father     No Known Allergies  Objective: Vitals:   03/28/18 0939  BP: 126/69  Pulse: 96  Temp: 98.7 F (37.1 C)  Weight: 113 lb (51.3 kg)   Body mass index is 21.35 kg/m.  Physical Exam  Constitutional: She is oriented to person, place, and time.  She is a little anxious but in good spirits otherwise.  Cardiovascular: Normal rate, regular rhythm and normal heart sounds.  Pulmonary/Chest: Effort normal and breath sounds normal.  Neurological: She is alert and oriented to person, place, and time.  Skin:  There is a little bit of scarring to her anterior chest wall abscess was but no evidence of active infection.  She has a small erythematous nodule on her left cheek.  She has a larger, firm erythematous nodule on her right cheek.  There is no fluctuance or drainage.  Psychiatric: She has a normal mood and affect.  Lab Results    Problem List Items Addressed This Visit      High   Cystic acne   Relevant Medications   clindamycin (CLEOCIN T) 1 % lotion   cephALEXin (KEFLEX) 500 MG capsule       Cliffton Asters, MD [facility] for Infectious [facility] [phone] pager   [phone] cell 03/28/2018, 9:52 AM

## 2018-03-28 NOTE — Assessment & Plan Note (Signed)
I strongly suspect that her chest wall abscess, pneumonia and bacteremia have been cured.

## 2018-04-03 ENCOUNTER — Ambulatory Visit (INDEPENDENT_AMBULATORY_CARE_PROVIDER_SITE_OTHER): Payer: 59 | Admitting: Internal Medicine

## 2018-04-03 ENCOUNTER — Encounter: Payer: Self-pay | Admitting: Internal Medicine

## 2018-04-03 DIAGNOSIS — L700 Acne vulgaris: Secondary | ICD-10-CM

## 2018-04-03 DIAGNOSIS — B9561 Methicillin susceptible Staphylococcus aureus infection as the cause of diseases classified elsewhere: Secondary | ICD-10-CM

## 2018-04-03 DIAGNOSIS — R7881 Bacteremia: Secondary | ICD-10-CM | POA: Diagnosis not present

## 2018-04-03 NOTE — Progress Notes (Signed)
[facility]  Patient Active Problem List   Diagnosis Date Noted  . Bacteremia due to methicillin susceptible Staphylococcus aureus (MSSA) 02/06/2018    Priority: High  . Aortic regurgitation 02/06/2018    Priority: High  . Cystic acne 02/06/2018    Priority: High  . Multifocal pneumonia 02/05/2018    Priority: High  . Abscess of chest wall 02/05/2018    Priority: High  . Normocytic anemia 02/06/2018  . Anxiety 02/06/2018  . Left thyroid nodule 02/05/2018    Patient's Medications  New Prescriptions   No medications on file  Previous Medications   CEPHALEXIN (KEFLEX) 500 MG CAPSULE    Take 1 capsule (500 mg total) by mouth 3 (three) times daily.   CLINDAMYCIN (CLEOCIN T) 1 % LOTION    Apply topically 2 (two) times daily.   HYDROCORTISONE CREAM 1 %    Apply 1 application topically 2 (two) times daily as needed for itching.   PRENATAL VITAMIN W/FE, FA (PRENATAL 1 + 1) 27-1 MG TABS    Take 1 tablet by mouth daily.    Modified Medications   No medications on file  Discontinued Medications   No medications on file    Subjective: Jadeyn is in for her routine follow-up visit.  Her 2 small boils that developed on her cheeks bilaterally last week are resolving.  She did have some spontaneous drainage from her right cheek lesion 5 days ago.  She completes oral cephalexin tomorrow and continues to use topical clindamycin.  Review of Systems: Review of Systems  Constitutional: Negative for chills, diaphoresis, fever and weight loss.  Skin: Positive for rash.       As noted in HPI.    Past Medical History:  Diagnosis Date  . Cystic acne   . History of chicken pox   . Pregnancy induced hypertension    first pregnancy    Social History   Tobacco Use  . Smoking status: Never Smoker  . Smokeless tobacco: Never Used  Substance Use Topics  . Alcohol use: No  . Drug use: No    Family History  Problem Relation Age of Onset  . Kidney  Stones Mother   . Kidney Stones Father   . Prostate cancer Father     No Known Allergies  Objective: Vitals:   04/03/18 1420  BP: (!) 144/78  Pulse: 78  Temp: 98.3 F (36.8 C)  TempSrc: Oral  Weight: 113 lb (51.3 kg)  Height: 5\' 1"  (1.549 m)   Body mass index is 21.35 kg/m.  Physical Exam  Constitutional: She is oriented to person, place, and time.  She is in good spirits.  Cardiovascular: Normal rate and normal heart sounds.  Neurological: She is alert and oriented to person, place, and time.  Skin:  The nodular lesions on her cheeks are resolving.  She only has a little bit of residual erythema.  There is no fluctuance or drainage.  Psychiatric: She has a normal mood and affect.    Lab Results    Problem List Items Addressed This Visit      High   Cystic acne    She has had problems with recurrent cystic acne over the past 3 years.  This may have been the major risk factor for her recent staph aureus bacteremia.  We will have her continue topical clindamycin for now and recommended that she follow-up with a dermatologist.      Bacteremia  due to methicillin susceptible Staphylococcus aureus (MSSA)    Her staph aureus bacteremia has resolved.  She can follow-up here as needed.          Cliffton Asters, MD [facility] [phone] pager   [phone] cell 04/03/2018, 2:37 PM

## 2018-04-03 NOTE — Assessment & Plan Note (Signed)
Her staph aureus bacteremia has resolved.  She can follow-up here as needed.

## 2018-04-03 NOTE — Assessment & Plan Note (Signed)
She has had problems with recurrent cystic acne over the past 3 years.  This may have been the major risk factor for her recent staph aureus bacteremia.  We will have her continue topical clindamycin for now and recommended that she follow-up with a dermatologist.

## 2018-06-04 ENCOUNTER — Other Ambulatory Visit: Payer: Self-pay | Admitting: Internal Medicine

## 2018-06-04 DIAGNOSIS — L700 Acne vulgaris: Secondary | ICD-10-CM

## 2018-06-04 MED ORDER — CEPHALEXIN 500 MG PO CAPS: 500.0000 mg | ORAL_CAPSULE | Freq: Three times a day (TID) | ORAL | 0 refills | Status: AC

## 2019-02-06 IMAGING — CT CT ANGIO CHEST
2 of 7 series · 18 of 36 positions shown · IV contrast (iopamidol)
Comparison: Chest and abdominal radiographs earlier this day.

CLINICAL DATA: PE suspected, intermediate prob, positive D-dimer.
Question nodule on radiograph.

EXAM:
CT ANGIOGRAPHY CHEST WITH CONTRAST
TECHNIQUE: Multidetector CT imaging of the chest was performed using the
standard protocol during bolus administration of intravenous
contrast. Multiplanar CT image reconstructions and MIPs were
obtained to evaluate the vascular anatomy.
CONTRAST:  100mL [id number] IOPAMIDOL ([id number]) INJECTION 76%

[Series 7: pe coronal mpr · coronal · 0.48mm/px · 1 of 96 slices shown]
[im 48/96  mediastinal]
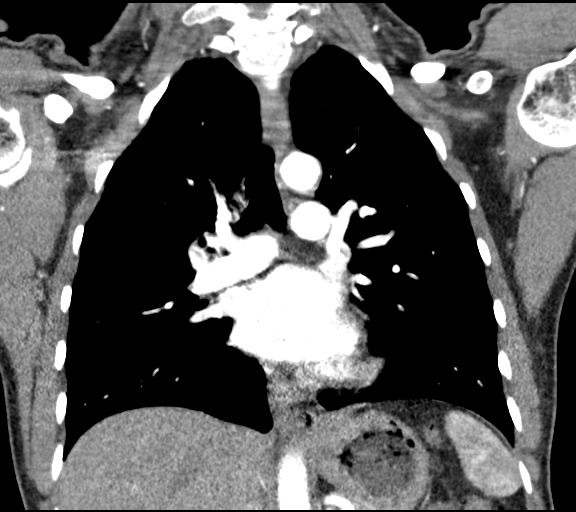

[Series 11: pe thins · axial · 0.45mm/px · z∈[-211,+8]mm · 17 of 245 slices shown]
[im 13/245  lung]
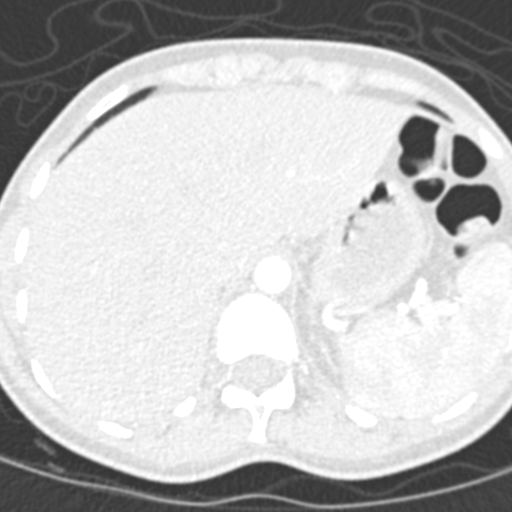
[im 25/245  mediastinal]
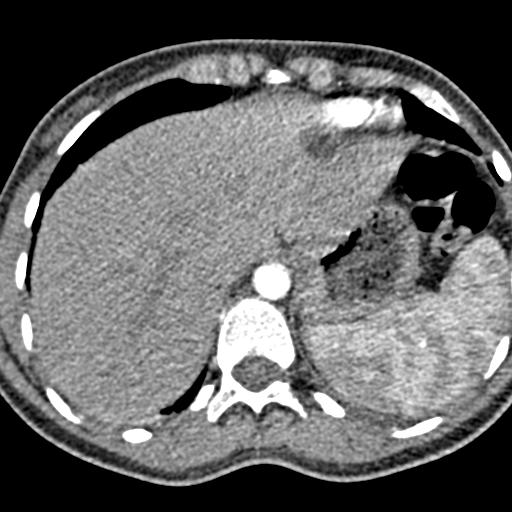
[im 37/245  lung]
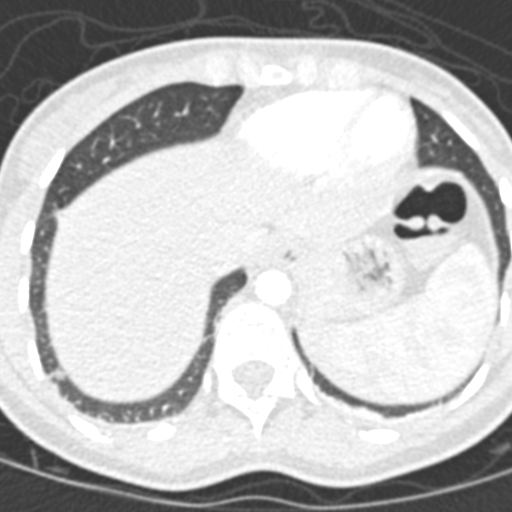
[im 49/245  mediastinal]
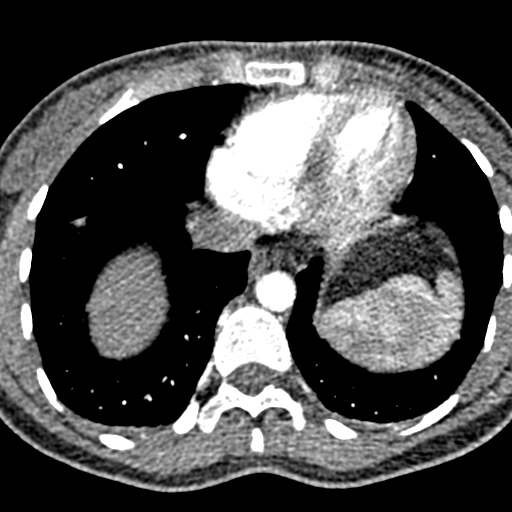
[im 74/245  lung]
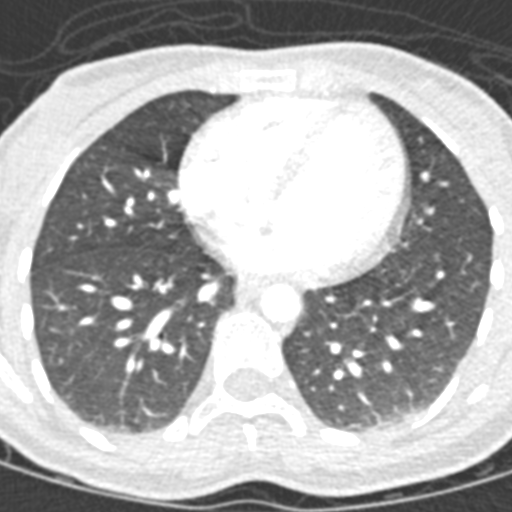
[im 86/245  mediastinal]
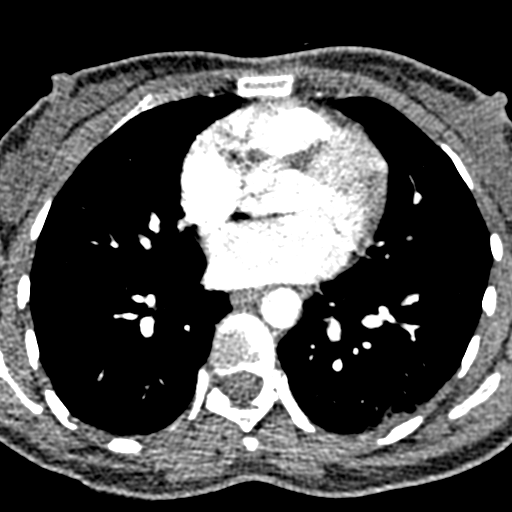
[im 98/245  lung]
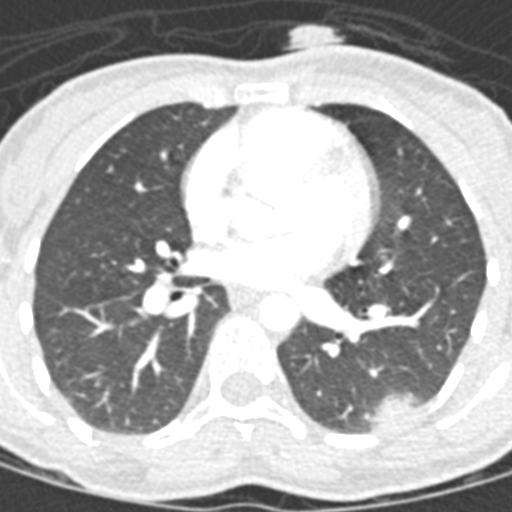
[im 110/245  mediastinal]
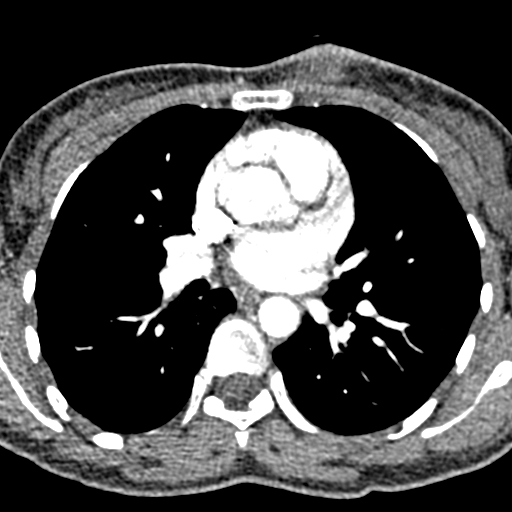
[im 123/245  lung]
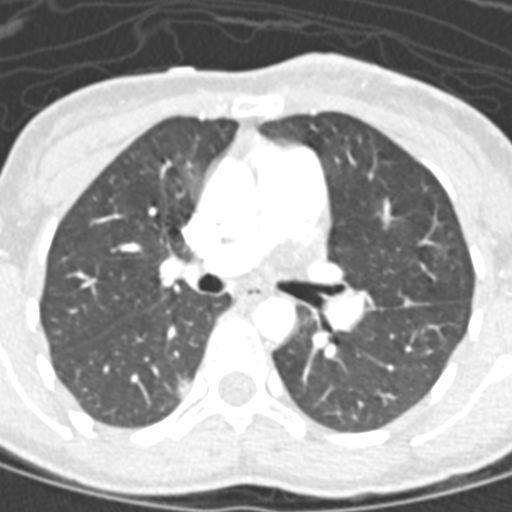
[im 135/245  mediastinal]
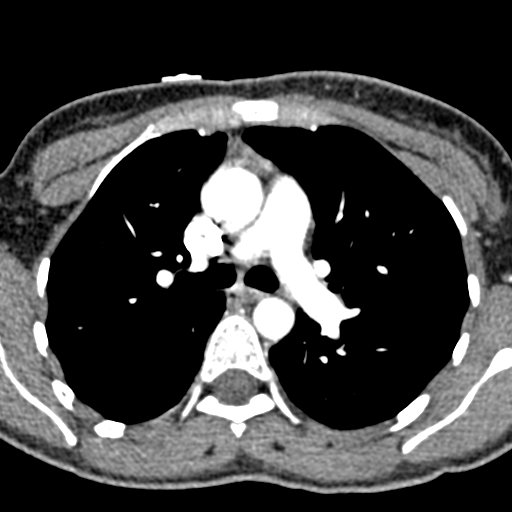
[im 147/245  lung]
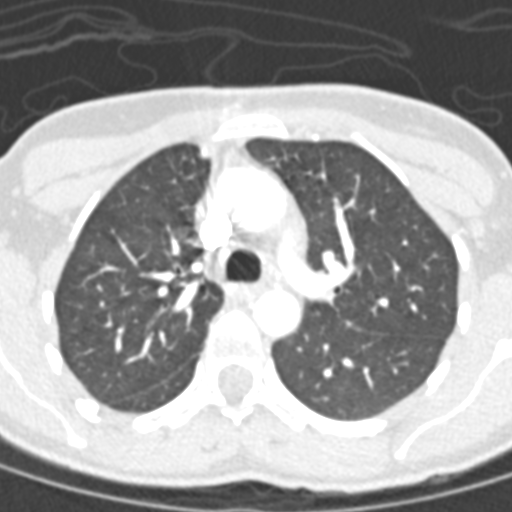
[im 159/245  mediastinal]
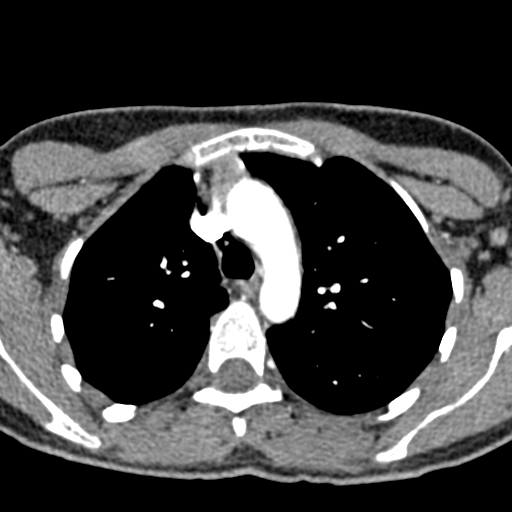
[im 171/245  lung]
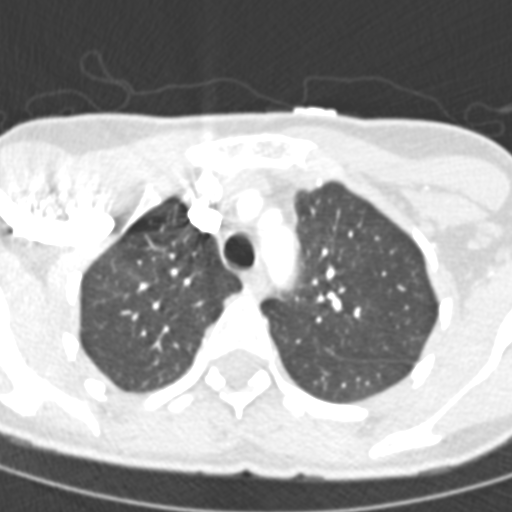
[im 196/245  mediastinal]
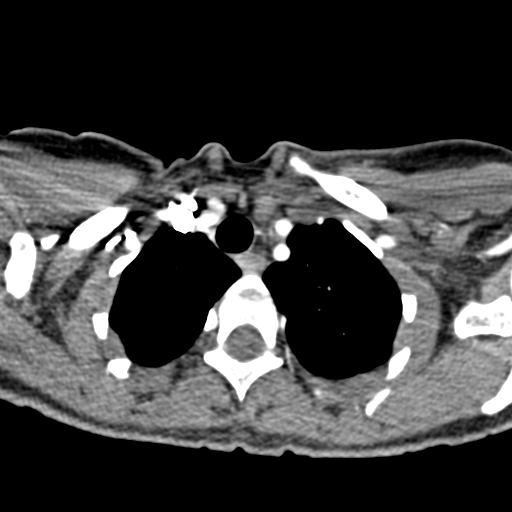
[im 208/245  lung]
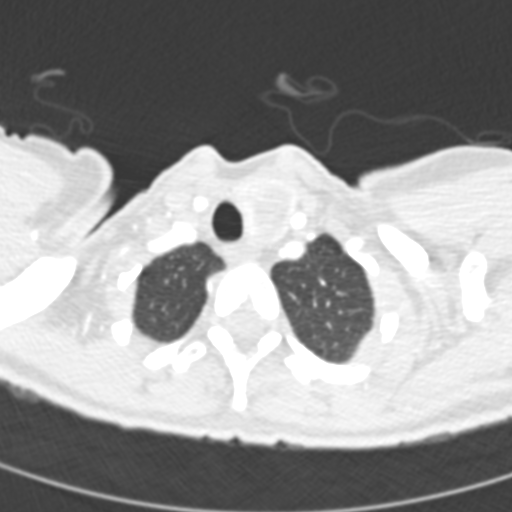
[im 220/245  mediastinal]
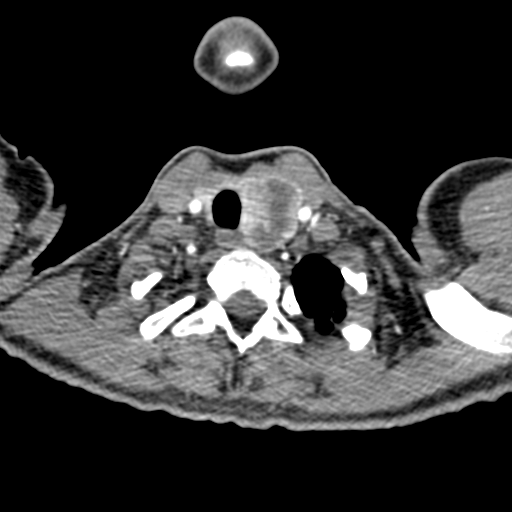
[im 232/245  lung]
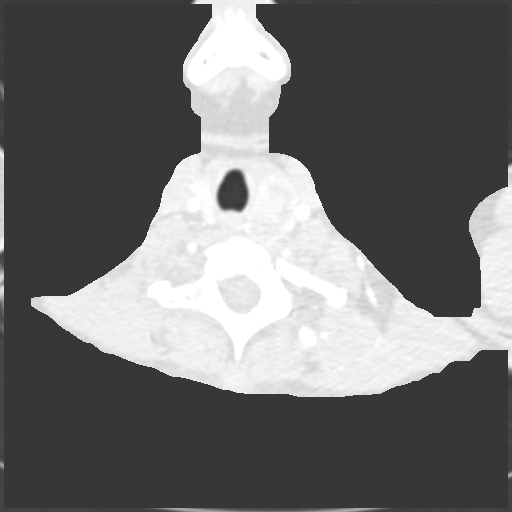

[18 of 36 positions shown; findings below may reference images not displayed]

FINDINGS: Cardiovascular: There are no filling defects within the pulmonary
arteries to the segmental level to suggest pulmonary embolus. Distal
most subsegmental branches are not well assessed. Thoracic aorta is
normal in caliber without dissection. Heart is normal in size. No
pericardial effusion.

Mediastinum/Nodes: No enlarged mediastinal or hilar lymph nodes.
Prominent left axillary nodes measure up to 8 mm short axis. Large
hypodense 3.2 cm nodule in the left lobe of the thyroid gland. The
esophagus is decompressed.

Lungs/Pleura: Multiple peripheral ill-defined and nodular opacities
within both lungs primarily in the lower lobes, largest in the left
lower lobe abuts the pleura and accounts for radiographic
abnormality. There is minimal surrounding ground-glass about these
nodular densities. No pulmonary edema. Trachea and mainstem bronchi
are patent. Bilateral pleural thickening without frank pleural
effusion. Mild dependent atelectasis in the lower lobes.

Upper Abdomen: No acute abnormality.

Musculoskeletal: Skin thickening with ill-defined 2 cm subcutaneous
density in the left anterior chest wall just off midline. No soft
tissue air. There are no acute or suspicious osseous abnormalities.

Review of the MIP images confirms the above findings.
IMPRESSION: 1. No pulmonary embolus.
2. Multifocal pulmonary opacities in both lungs primarily
peripherally with some ground-glass density. Peripheral distribution
raises concern for septic emboli or pulmonary infarct, however no
pulmonary embolus is visualized. Infectious etiology with multifocal
pneumonia also considered.
3. Skin thickening and ill-defined 2 cm subcutaneous density in the
left anterior chest wall suspicious for abscess. Prominent left
axillary nodes are likely reactive.
4. Left thyroid nodule measuring 3.2 cm. Recommend elective
nonemergent thyroid ultrasound for characterization.

These results were called by telephone at the time of interpretation
on 02/05/2018 at [DATE] to Dr. DEEQA RAYAAN ADLAHO , who verbally
acknowledged these results.

## 2019-02-07 IMAGING — DX DG CHEST 1V
1 series · 1 of 1 positions shown · non-contrast
Comparison: Chest CT 02/05/2018

CLINICAL DATA: Pneumonia

EXAM:
CHEST  1 VIEW

[chest ap]
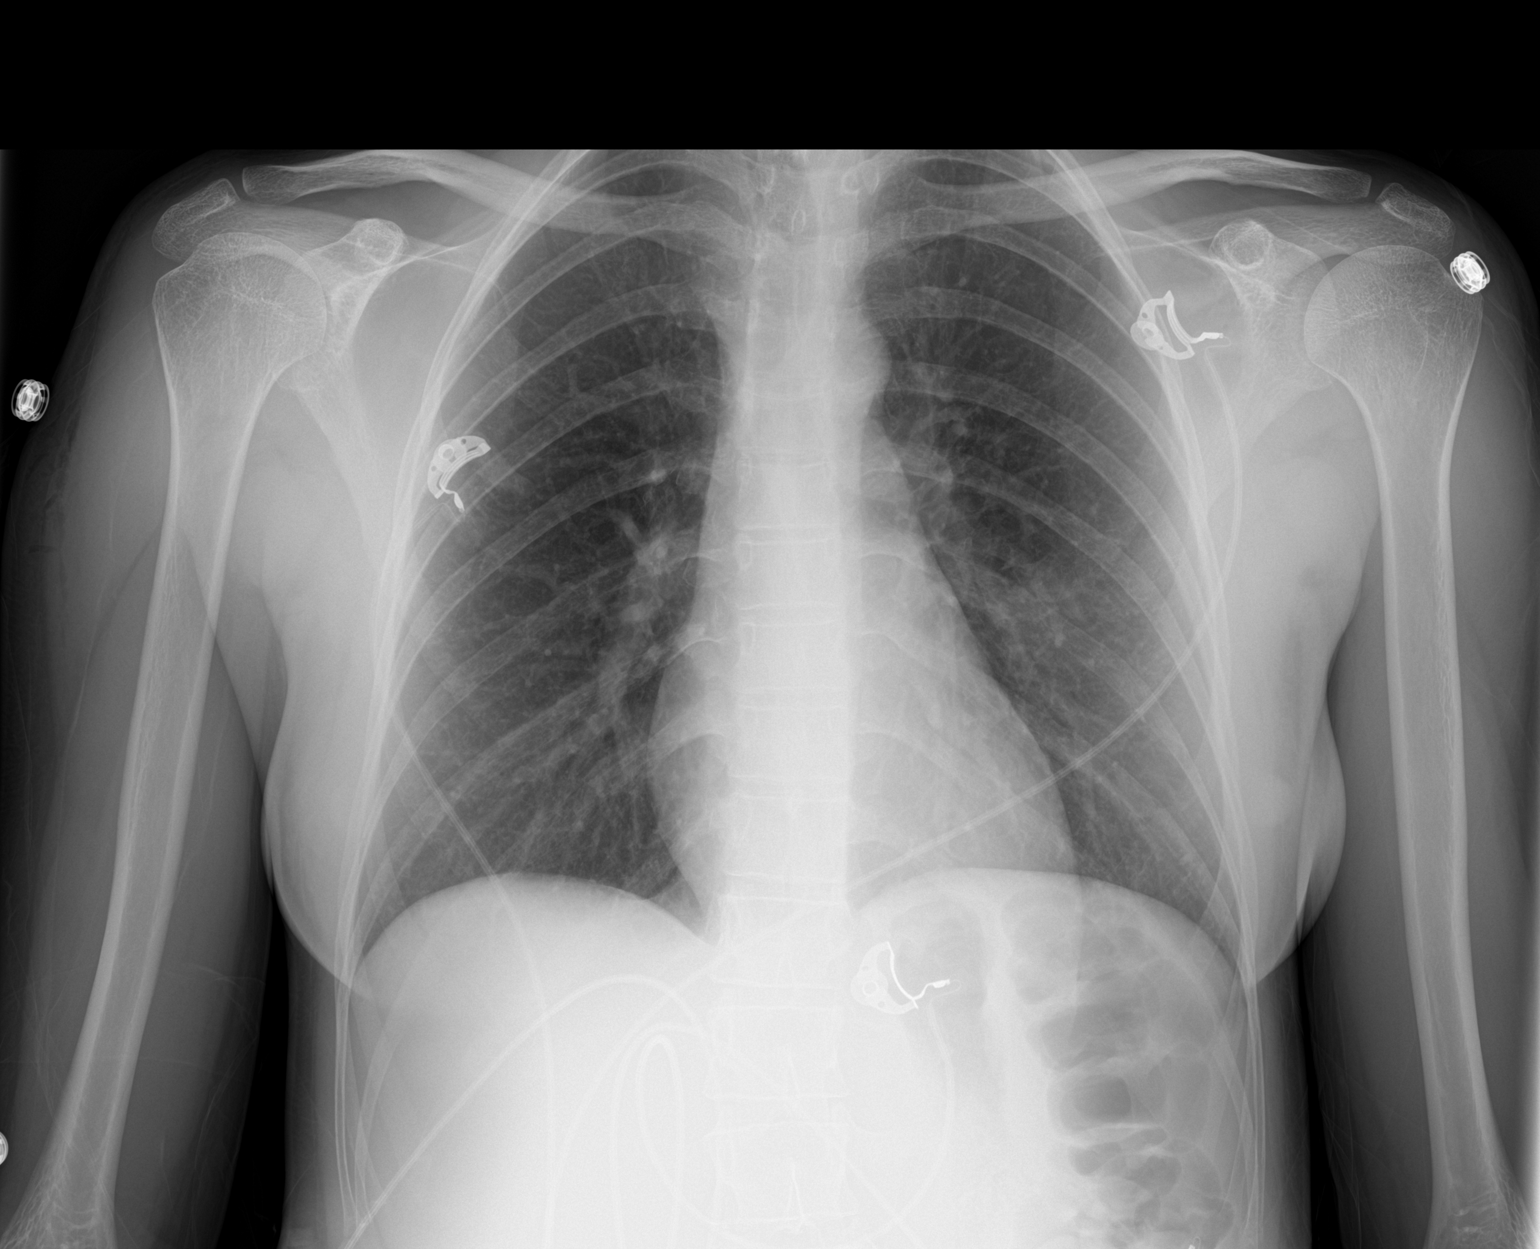

[1 of 1 positions shown; findings below may reference images not displayed]

FINDINGS: 3115 hours. AP portable exam shows no focal lung consolidation. No
pulmonary edema or pleural effusion. The cardiopericardial
silhouette is within normal limits for size. The visualized bony
structures of the thorax are intact. Telemetry leads overlie the
chest.
IMPRESSION: Multifocal airspace disease seen on yesterday's CT chest not readily
evident on x-ray.
